# Patient Record
Sex: Female | Born: 1940
Health system: Southern US, Community
[De-identification: ages and names within clinical notes are randomized; demographics above are authoritative.]

## PROBLEM LIST (undated history)

## (undated) DIAGNOSIS — M1990 Unspecified osteoarthritis, unspecified site: Secondary | ICD-10-CM

## (undated) DIAGNOSIS — R011 Cardiac murmur, unspecified: Secondary | ICD-10-CM

## (undated) DIAGNOSIS — H269 Unspecified cataract: Secondary | ICD-10-CM

## (undated) DIAGNOSIS — K219 Gastro-esophageal reflux disease without esophagitis: Secondary | ICD-10-CM

## (undated) DIAGNOSIS — K56609 Unspecified intestinal obstruction, unspecified as to partial versus complete obstruction: Secondary | ICD-10-CM

## (undated) DIAGNOSIS — T7840XA Allergy, unspecified, initial encounter: Secondary | ICD-10-CM

## (undated) DIAGNOSIS — M810 Age-related osteoporosis without current pathological fracture: Secondary | ICD-10-CM

## (undated) DIAGNOSIS — I10 Essential (primary) hypertension: Secondary | ICD-10-CM

## (undated) HISTORY — DX: Gastro-esophageal reflux disease without esophagitis: K21.9

## (undated) HISTORY — DX: Essential (primary) hypertension: I10

## (undated) HISTORY — DX: Unspecified cataract: H26.9

## (undated) HISTORY — DX: Age-related osteoporosis without current pathological fracture: M81.0

## (undated) HISTORY — DX: Cardiac murmur, unspecified: R01.1

## (undated) HISTORY — PX: COLONOSCOPY: SHX174

## (undated) HISTORY — DX: Unspecified osteoarthritis, unspecified site: M19.90

## (undated) HISTORY — PX: UPPER GASTROINTESTINAL ENDOSCOPY: SHX188

## (undated) HISTORY — PX: CATARACT EXTRACTION: SUR2

## (undated) HISTORY — DX: Allergy, unspecified, initial encounter: T78.40XA

## (undated) HISTORY — PX: LAPAROSCOPIC HYSTERECTOMY: SHX1926

## (undated) HISTORY — DX: Unspecified intestinal obstruction, unspecified as to partial versus complete obstruction: K56.609

---

## 1975-10-09 HISTORY — PX: TUBAL LIGATION: SHX77

## 1988-10-08 HISTORY — PX: KNEE ARTHROSCOPY WITH EXCISION BAKER'S CYST: SHX5646

## 1988-10-08 HISTORY — PX: WISDOM TOOTH EXTRACTION: SHX21

## 1998-01-26 ENCOUNTER — Other Ambulatory Visit: Admission: RE | Admit: 1998-01-26 | Discharge: 1998-01-26 | Payer: Self-pay | Admitting: Gynecology

## 1998-08-03 ENCOUNTER — Other Ambulatory Visit: Admission: RE | Admit: 1998-08-03 | Discharge: 1998-08-03 | Payer: Self-pay | Admitting: Gynecology

## 1998-08-10 ENCOUNTER — Ambulatory Visit (HOSPITAL_COMMUNITY): Admission: RE | Admit: 1998-08-10 | Discharge: 1998-08-10 | Payer: Self-pay | Admitting: Obstetrics & Gynecology

## 1998-11-02 ENCOUNTER — Encounter: Payer: Self-pay | Admitting: Cardiology

## 1998-11-24 ENCOUNTER — Other Ambulatory Visit: Admission: RE | Admit: 1998-11-24 | Discharge: 1998-11-24 | Payer: Self-pay | Admitting: Internal Medicine

## 1998-11-24 ENCOUNTER — Encounter: Payer: Self-pay | Admitting: Internal Medicine

## 1999-01-03 ENCOUNTER — Other Ambulatory Visit: Admission: RE | Admit: 1999-01-03 | Discharge: 1999-01-03 | Payer: Self-pay | Admitting: Orthopedic Surgery

## 1999-01-17 ENCOUNTER — Other Ambulatory Visit: Admission: RE | Admit: 1999-01-17 | Discharge: 1999-01-17 | Payer: Self-pay | Admitting: Gynecology

## 1999-08-07 ENCOUNTER — Other Ambulatory Visit: Admission: RE | Admit: 1999-08-07 | Discharge: 1999-08-07 | Payer: Self-pay | Admitting: Gynecology

## 2000-01-23 ENCOUNTER — Other Ambulatory Visit: Admission: RE | Admit: 2000-01-23 | Discharge: 2000-01-23 | Payer: Self-pay | Admitting: Gynecology

## 2000-01-23 ENCOUNTER — Encounter (INDEPENDENT_AMBULATORY_CARE_PROVIDER_SITE_OTHER): Payer: Self-pay | Admitting: Specialist

## 2000-08-22 ENCOUNTER — Other Ambulatory Visit: Admission: RE | Admit: 2000-08-22 | Discharge: 2000-08-22 | Payer: Self-pay | Admitting: Gynecology

## 2001-08-25 ENCOUNTER — Other Ambulatory Visit: Admission: RE | Admit: 2001-08-25 | Discharge: 2001-08-25 | Payer: Self-pay | Admitting: Gynecology

## 2002-08-27 ENCOUNTER — Other Ambulatory Visit: Admission: RE | Admit: 2002-08-27 | Discharge: 2002-08-27 | Payer: Self-pay | Admitting: Gynecology

## 2003-08-24 ENCOUNTER — Other Ambulatory Visit: Admission: RE | Admit: 2003-08-24 | Discharge: 2003-08-24 | Payer: Self-pay | Admitting: Gynecology

## 2003-12-27 ENCOUNTER — Encounter: Payer: Self-pay | Admitting: Internal Medicine

## 2004-08-24 ENCOUNTER — Other Ambulatory Visit: Admission: RE | Admit: 2004-08-24 | Discharge: 2004-08-24 | Payer: Self-pay | Admitting: Gynecology

## 2004-10-08 HISTORY — PX: BREAST ENHANCEMENT SURGERY: SHX7

## 2005-08-27 ENCOUNTER — Other Ambulatory Visit: Admission: RE | Admit: 2005-08-27 | Discharge: 2005-08-27 | Payer: Self-pay | Admitting: Gynecology

## 2008-08-17 DIAGNOSIS — Z8719 Personal history of other diseases of the digestive system: Secondary | ICD-10-CM | POA: Insufficient documentation

## 2008-08-17 DIAGNOSIS — R197 Diarrhea, unspecified: Secondary | ICD-10-CM | POA: Insufficient documentation

## 2008-08-17 DIAGNOSIS — R141 Gas pain: Secondary | ICD-10-CM | POA: Insufficient documentation

## 2008-08-17 DIAGNOSIS — R143 Flatulence: Secondary | ICD-10-CM

## 2008-08-17 DIAGNOSIS — K2980 Duodenitis without bleeding: Secondary | ICD-10-CM | POA: Insufficient documentation

## 2008-08-17 DIAGNOSIS — R142 Eructation: Secondary | ICD-10-CM

## 2008-08-17 DIAGNOSIS — K5730 Diverticulosis of large intestine without perforation or abscess without bleeding: Secondary | ICD-10-CM | POA: Insufficient documentation

## 2008-08-20 ENCOUNTER — Ambulatory Visit: Payer: Self-pay | Admitting: Internal Medicine

## 2008-08-20 DIAGNOSIS — R109 Unspecified abdominal pain: Secondary | ICD-10-CM | POA: Insufficient documentation

## 2008-11-24 ENCOUNTER — Ambulatory Visit: Payer: Self-pay | Admitting: Internal Medicine

## 2008-12-14 ENCOUNTER — Ambulatory Visit: Payer: Self-pay | Admitting: Internal Medicine

## 2011-01-01 ENCOUNTER — Other Ambulatory Visit: Payer: Self-pay | Admitting: Gynecology

## 2013-09-21 ENCOUNTER — Ambulatory Visit: Payer: Self-pay | Admitting: Podiatry

## 2013-09-21 ENCOUNTER — Ambulatory Visit (INDEPENDENT_AMBULATORY_CARE_PROVIDER_SITE_OTHER): Payer: Medicare PPO | Admitting: Podiatry

## 2013-09-21 ENCOUNTER — Encounter: Payer: Self-pay | Admitting: Podiatry

## 2013-09-21 ENCOUNTER — Ambulatory Visit (INDEPENDENT_AMBULATORY_CARE_PROVIDER_SITE_OTHER): Payer: Medicare PPO

## 2013-09-21 VITALS — BP 139/80 | HR 62 | Resp 12 | Ht 65.0 in | Wt 135.0 lb

## 2013-09-21 DIAGNOSIS — M722 Plantar fascial fibromatosis: Secondary | ICD-10-CM

## 2013-09-21 DIAGNOSIS — R52 Pain, unspecified: Secondary | ICD-10-CM

## 2013-09-21 MED ORDER — TRIAMCINOLONE ACETONIDE 10 MG/ML IJ SUSP
10.0000 mg | Freq: Once | INTRAMUSCULAR | Status: AC
  Administered 2013-09-21: 10 mg

## 2013-09-21 NOTE — Progress Notes (Signed)
   Subjective:    Patient ID: Jillian [name], female    DOB: 15-May-1941, 72 y.o.   MRN: [id number] "I have pain in my left heel. It hurts on this bone."    HPI Comments: N  Sore, aches L  Bunion Left Painful D  10 yrs. O  Gradually  C  Gotten worse A  Shoe pressure, high heels T  Adjust the size shoe I wear   Foot Pain This is a new (Heel Pain Lt) problem. Episode onset: September/October. The problem occurs daily. The problem has been gradually worsening. The symptoms are aggravated by walking and standing (getting up after resting). She has tried NSAIDs (Naproxen, night splint, stretching exercises, OTC orthotics) for the symptoms. The treatment provided mild relief.   Patient's primary concern is left inferior heel pain today. She relates a history of a right inferior heel pain that resolved with conservative care over a two-year period.   Review of Systems  Musculoskeletal: Positive for gait problem.  All other systems reviewed and are negative.       Objective:   Physical Exam  Subjective: Orientated x26 72 year old white female  Vascular: The DP and and PT pulses are two over four bilaterally. Capillary fill is immediate bilaterally.  Neurological: Knee and ankle reflexes equal and reactive bilaterally  Dermatological: Texture and turgor within normal limits  Musculoskeletal: HAV deformities noted bilaterally. Left first MPJ is limited in dorsiflexion. Exquisite palpable tenderness medial plantar left heel which duplicates her area of discomfort. There are no palpable lesions.  X-ray examination see attached report demonstrates a well-organized inferior heel spur left and narrowing of the left first MPJ with some spur formation.       Assessment & Plan:   Assessment: Plantar fasciitis left Hallux limitus left  Plan: Skin is prepped with alcohol and Betadine and 10 mg of Kenalog mixed with 10 mg of plain Xylocaine and 2.5 mg of plain Marcaine are injected into the  inferior heel left for Kenalog injection #1. Shoeing and stretching discussed. Patient has existing night splint which she will continue to wear. I advised to return if the symptoms are not improving in the next 30 days.  In regards to the left hallux limitus rigidus, patient states she is having minimal discomfort at this time and we will defer on active treatment at this point, and focus our attention on the left plantar fasciitis.

## 2013-09-21 NOTE — Patient Instructions (Signed)
Plantar Fasciitis (Heel Spur Syndrome)  with Rehab  The plantar fascia is a fibrous, ligament-like, soft-tissue structure that spans the bottom of the foot. Plantar fasciitis is a condition that causes pain in the foot due to inflammation of the tissue.  SYMPTOMS   · Pain and tenderness on the underneath side of the foot.  · Pain that worsens with standing or walking.  CAUSES   Plantar fasciitis is caused by irritation and injury to the plantar fascia on the underneath side of the foot. Common mechanisms of injury include:  · Direct trauma to bottom of the foot.  · Damage to a small nerve that runs under the foot where the main fascia attaches to the heel bone.  · Stress placed on the plantar fascia due to bone spurs.  RISK INCREASES WITH:   · Activities that place stress on the plantar fascia (running, jumping, pivoting, or cutting).  · Poor strength and flexibility.  · Improperly fitted shoes.  · Tight calf muscles.  · Flat feet.  · Failure to warm-up properly before activity.  · Obesity.  PREVENTION  · Warm up and stretch properly before activity.  · Allow for adequate recovery between workouts.  · Maintain physical fitness:  · Strength, flexibility, and endurance.  · Cardiovascular fitness.  · Maintain a health body weight.  · Avoid stress on the plantar fascia.  · Wear properly fitted shoes, including arch supports for individuals who have flat feet.  PROGNOSIS   If treated properly, then the symptoms of plantar fasciitis usually resolve without surgery. However, occasionally surgery is necessary.  RELATED COMPLICATIONS   · Recurrent symptoms that may result in a chronic condition.  · Problems of the lower back that are caused by compensating for the injury, such as limping.  · Pain or weakness of the foot during push-off following surgery.  · Chronic inflammation, scarring, and partial or complete fascia tear, occurring more often from repeated injections.  TREATMENT   Treatment initially involves the use of  ice and medication to help reduce pain and inflammation. The use of strengthening and stretching exercises may help reduce pain with activity, especially stretches of the Achilles tendon. These exercises may be performed at home or with a therapist. Your caregiver may recommend that you use heel cups of arch supports to help reduce stress on the plantar fascia. Occasionally, corticosteroid injections are given to reduce inflammation. If symptoms persist for greater than 6 months despite non-surgical (conservative), then surgery may be recommended.   MEDICATION   · If pain medication is necessary, then nonsteroidal anti-inflammatory medications, such as aspirin and ibuprofen, or other minor pain relievers, such as acetaminophen, are often recommended.  · Do not take pain medication within 7 days before surgery.  · Prescription pain relievers may be given if deemed necessary by your caregiver. Use only as directed and only as much as you need.  · Corticosteroid injections may be given by your caregiver. These injections should be reserved for the most serious cases, because they may only be given a certain number of times.  HEAT AND COLD  · Cold treatment (icing) relieves pain and reduces inflammation. Cold treatment should be applied for 10 to 15 minutes every 2 to 3 hours for inflammation and pain and immediately after any activity that aggravates your symptoms. Use ice packs or massage the area with a piece of ice (ice massage).  · Heat treatment may be used prior to performing the stretching and strengthening activities prescribed   by your caregiver, physical therapist, or athletic trainer. Use a heat pack or soak the injury in warm water.  SEEK IMMEDIATE MEDICAL CARE IF:  · Treatment seems to offer no benefit, or the condition worsens.  · Any medications produce adverse side effects.  EXERCISES  RANGE OF MOTION (ROM) AND STRETCHING EXERCISES - Plantar Fasciitis (Heel Spur Syndrome)  These exercises may help you  when beginning to rehabilitate your injury. Your symptoms may resolve with or without further involvement from your physician, physical therapist or athletic trainer. While completing these exercises, remember:   · Restoring tissue flexibility helps normal motion to return to the joints. This allows healthier, less painful movement and activity.  · An effective stretch should be held for at least 30 seconds.  · A stretch should never be painful. You should only feel a gentle lengthening or release in the stretched tissue.  RANGE OF MOTION - Toe Extension, Flexion  · Sit with your right / left leg crossed over your opposite knee.  · Grasp your toes and gently pull them back toward the top of your foot. You should feel a stretch on the bottom of your toes and/or foot.  · Hold this stretch for __________ seconds.  · Now, gently pull your toes toward the bottom of your foot. You should feel a stretch on the top of your toes and or foot.  · Hold this stretch for __________ seconds.  Repeat __________ times. Complete this stretch __________ times per day.   RANGE OF MOTION - Ankle Dorsiflexion, Active Assisted  · Remove shoes and sit on a chair that is preferably not on a carpeted surface.  · Place right / left foot under knee. Extend your opposite leg for support.  · Keeping your heel down, slide your right / left foot back toward the chair until you feel a stretch at your ankle or calf. If you do not feel a stretch, slide your bottom forward to the edge of the chair, while still keeping your heel down.  · Hold this stretch for __________ seconds.  Repeat __________ times. Complete this stretch __________ times per day.   STRETCH  Gastroc, Standing  · Place hands on wall.  · Extend right / left leg, keeping the front knee somewhat bent.  · Slightly point your toes inward on your back foot.  · Keeping your right / left heel on the floor and your knee straight, shift your weight toward the wall, not allowing your back to  arch.  · You should feel a gentle stretch in the right / left calf. Hold this position for __________ seconds.  Repeat __________ times. Complete this stretch __________ times per day.  STRETCH  Soleus, Standing  · Place hands on wall.  · Extend right / left leg, keeping the other knee somewhat bent.  · Slightly point your toes inward on your back foot.  · Keep your right / left heel on the floor, bend your back knee, and slightly shift your weight over the back leg so that you feel a gentle stretch deep in your back calf.  · Hold this position for __________ seconds.  Repeat __________ times. Complete this stretch __________ times per day.  STRETCH  Gastrocsoleus, Standing   Note: This exercise can place a lot of stress on your foot and ankle. Please complete this exercise only if specifically instructed by your caregiver.   · Place the ball of your right / left foot on a step, keeping   your other foot firmly on the same step.  · Hold on to the wall or a rail for balance.  · Slowly lift your other foot, allowing your body weight to press your heel down over the edge of the step.  · You should feel a stretch in your right / left calf.  · Hold this position for __________ seconds.  · Repeat this exercise with a slight bend in your right / left knee.  Repeat __________ times. Complete this stretch __________ times per day.   STRENGTHENING EXERCISES - Plantar Fasciitis (Heel Spur Syndrome)   These exercises may help you when beginning to rehabilitate your injury. They may resolve your symptoms with or without further involvement from your physician, physical therapist or athletic trainer. While completing these exercises, remember:   · Muscles can gain both the endurance and the strength needed for everyday activities through controlled exercises.  · Complete these exercises as instructed by your physician, physical therapist or athletic trainer. Progress the resistance and repetitions only as guided.  STRENGTH - Towel  Curls  · Sit in a chair positioned on a non-carpeted surface.  · Place your foot on a towel, keeping your heel on the floor.  · Pull the towel toward your heel by only curling your toes. Keep your heel on the floor.  · If instructed by your physician, physical therapist or athletic trainer, add ____________________ at the end of the towel.  Repeat __________ times. Complete this exercise __________ times per day.  STRENGTH - Ankle Inversion  · Secure one end of a rubber exercise band/tubing to a fixed object (table, pole). Loop the other end around your foot just before your toes.  · Place your fists between your knees. This will focus your strengthening at your ankle.  · Slowly, pull your big toe up and in, making sure the band/tubing is positioned to resist the entire motion.  · Hold this position for __________ seconds.  · Have your muscles resist the band/tubing as it slowly pulls your foot back to the starting position.  Repeat __________ times. Complete this exercises __________ times per day.   Document Released: 09/24/2005 Document Revised: 12/17/2011 Document Reviewed: 01/06/2009  [employer]® Patient Information ©2014 [employer].

## 2013-11-20 ENCOUNTER — Encounter: Payer: Self-pay | Admitting: Internal Medicine

## 2014-01-15 ENCOUNTER — Ambulatory Visit (AMBULATORY_SURGERY_CENTER): Payer: Self-pay | Admitting: *Deleted

## 2014-01-15 VITALS — Ht 65.0 in | Wt 137.0 lb

## 2014-01-15 DIAGNOSIS — Z86010 Personal history of colonic polyps: Secondary | ICD-10-CM

## 2014-01-15 MED ORDER — MOVIPREP 100 G PO SOLR: ORAL | Status: DC

## 2014-01-15 NOTE — Progress Notes (Signed)
No allergies to eggs or soy. No problems with anesthesia.  Pt given Emmi instructions for colonoscopy  

## 2014-01-25 ENCOUNTER — Other Ambulatory Visit: Payer: Self-pay | Admitting: Gynecology

## 2014-01-28 ENCOUNTER — Encounter: Payer: Self-pay | Admitting: Internal Medicine

## 2014-01-29 ENCOUNTER — Encounter: Payer: Self-pay | Admitting: Internal Medicine

## 2014-02-10 ENCOUNTER — Encounter: Payer: Self-pay | Admitting: Internal Medicine

## 2014-02-10 ENCOUNTER — Ambulatory Visit (AMBULATORY_SURGERY_CENTER): Payer: Medicare PPO | Admitting: Internal Medicine

## 2014-02-10 VITALS — BP 128/77 | HR 45 | Temp 97.6°F | Resp 16 | Ht 65.0 in | Wt 137.0 lb

## 2014-02-10 DIAGNOSIS — D129 Benign neoplasm of anus and anal canal: Secondary | ICD-10-CM

## 2014-02-10 DIAGNOSIS — Z86010 Personal history of colonic polyps: Secondary | ICD-10-CM

## 2014-02-10 DIAGNOSIS — D128 Benign neoplasm of rectum: Secondary | ICD-10-CM

## 2014-02-10 DIAGNOSIS — D126 Benign neoplasm of colon, unspecified: Secondary | ICD-10-CM

## 2014-02-10 DIAGNOSIS — Z800 Family history of malignant neoplasm of digestive organs: Secondary | ICD-10-CM

## 2014-02-10 DIAGNOSIS — Z1211 Encounter for screening for malignant neoplasm of colon: Secondary | ICD-10-CM

## 2014-02-10 MED ORDER — SODIUM CHLORIDE 0.9 % IV SOLN: 500.0000 mL | INTRAVENOUS | Status: DC

## 2014-02-10 NOTE — Progress Notes (Signed)
A/ox3 pleased with MAC, report to April RN 

## 2014-02-10 NOTE — Progress Notes (Signed)
Called to room to assist during endoscopic procedure.  Patient ID and intended procedure confirmed with present staff. Received instructions for my participation in the procedure from the performing physician.  

## 2014-02-10 NOTE — Patient Instructions (Signed)

## 2014-02-10 NOTE — Op Note (Signed)
[facility]  [address] [place], [id number]   COLONOSCOPY PROCEDURE REPORT  PATIENT: Jillian [name], Jillian [name]  MR#: [id number] BIRTHDATE: 1941-06-19 , 73  yrs. old GENDER: Female ENDOSCOPIST: Lafayette Dragon, MD REFERRED DI:YMEBRA Delight Hoh, M.D. PROCEDURE DATE:  02/10/2014 PROCEDURE:   Colonoscopy with cold biopsy polypectomy First Screening Colonoscopy - Avg.  risk and is 50 yrs.  old or older - No.  Prior Negative Screening - Now for repeat screening. Above average risk  History of Adenoma - Now for follow-up colonoscopy & has been > or = to 3 yrs.  N/A  Polyps Removed Today? Yes. ASA CLASS:   Class II INDICATIONS:mother with colon cancer. Previous colonoscopies in 1994, 2000, 2005 and 2000 and MEDICATIONS: MAC sedation, administered by CRNA and propofol (Diprivan) 200mg  IV  DESCRIPTION OF PROCEDURE:   After the risks benefits and alternatives of the procedure were thoroughly explained, informed consent was obtained.  A digital rectal exam revealed no abnormalities of the rectum.   The LB [id number]  endoscope was introduced through the anus and advanced to the cecum, which was identified by both the appendix and ileocecal valve. No adverse events experienced.   The quality of the prep was good, using MoviPrep  The instrument was then slowly withdrawn as the colon was fully examined.      COLON FINDINGS: A sessile polyp ranging between 5-75mm in size was found in the rectum.  A polypectomy was performed with cold forceps.  The resection was complete and the polyp tissue was completely retrieved.   Mild diverticulosis was noted in the sigmoid colon.  Retroflexed views revealed no abnormalities. The time to cecum=6 minutes 05 seconds.  Withdrawal time=8 minutes 03 seconds.  The scope was withdrawn and the procedure completed. COMPLICATIONS: There were no complications.  ENDOSCOPIC IMPRESSION: 1.   Sessile polyp ranging between 5-50mm in size was found in  the rectum; polypectomy was performed with cold forceps 2.   Mild diverticulosis was noted in the sigmoid colon  RECOMMENDATIONS: 1.  Await pathology results 2.  high fiber diet Recall colonoscopy in 5 years pending path report   eSigned:  Lafayette Dragon, MD 02/10/2014 9:57 AM   cc:   PATIENT NAME:  Jillian [name], Jillian [name] MR#: [id number]

## 2014-02-11 ENCOUNTER — Telehealth: Payer: Self-pay

## 2014-02-11 NOTE — Telephone Encounter (Signed)
  Follow up Call-  Call back number 02/10/2014  Post procedure Call Back phone  # [phone]  Permission to leave phone message Yes     Patient questions:  Do you have a fever, pain , or abdominal swelling? no Pain Score  0 *  Have you tolerated food without any problems? yes  Have you been able to return to your normal activities? yes  Do you have any questions about your discharge instructions: Diet   no Medications  no Follow up visit  no  Do you have questions or concerns about your Care? no  Actions: * If pain score is 4 or above: No action needed, pain <4.  Per the pt "everything is fine". maw

## 2014-02-15 ENCOUNTER — Encounter: Payer: Self-pay | Admitting: Internal Medicine

## 2015-01-14 ENCOUNTER — Telehealth: Payer: Self-pay | Admitting: Internal Medicine

## 2015-01-14 NOTE — Telephone Encounter (Signed)
Patient reports symptoms for 3 weeks approximately. She has spells of diarrhea with urgency. It may occurs after eating or it may awaken her from sleep. It is unpredictable and intermittent. She states there is no pain, no blood and no nausea. She has eliminated foods from her diet but it does not affect the diarrhea spells. No recent travel. She has city water. She states she does not feel sick. Please advise.

## 2015-03-16 DIAGNOSIS — Z0000 Encounter for general adult medical examination without abnormal findings: Secondary | ICD-10-CM | POA: Diagnosis not present

## 2015-03-16 DIAGNOSIS — K219 Gastro-esophageal reflux disease without esophagitis: Secondary | ICD-10-CM | POA: Diagnosis not present

## 2015-03-16 DIAGNOSIS — M8580 Other specified disorders of bone density and structure, unspecified site: Secondary | ICD-10-CM | POA: Diagnosis not present

## 2015-03-16 DIAGNOSIS — J309 Allergic rhinitis, unspecified: Secondary | ICD-10-CM | POA: Diagnosis not present

## 2015-03-16 DIAGNOSIS — Z1212 Encounter for screening for malignant neoplasm of rectum: Secondary | ICD-10-CM | POA: Diagnosis not present

## 2015-06-17 DIAGNOSIS — J029 Acute pharyngitis, unspecified: Secondary | ICD-10-CM | POA: Diagnosis not present

## 2015-07-05 ENCOUNTER — Telehealth: Payer: Self-pay | Admitting: Internal Medicine

## 2015-07-05 NOTE — Telephone Encounter (Signed)
Patient calling due to change in bowels. States she is having random episodes of urgent diarrhea. States she was shopping a couple weeks ago and had urgent urge for BM that was lots of diarrhea. She also woke up during the night yesterday and had urgent diarrhea. She states she had some pencil like stools a couple of times also. She is going out of the country in [place] and would like to be seen to evaluate this prior to her tip.Former [employer] pt. Scheduled with Dr. Havery Moros on 07/12/15 at 2:45 PM.

## 2015-07-12 ENCOUNTER — Encounter: Payer: Self-pay | Admitting: Gastroenterology

## 2015-07-12 ENCOUNTER — Other Ambulatory Visit (INDEPENDENT_AMBULATORY_CARE_PROVIDER_SITE_OTHER): Payer: Medicare PPO

## 2015-07-12 ENCOUNTER — Ambulatory Visit (INDEPENDENT_AMBULATORY_CARE_PROVIDER_SITE_OTHER): Payer: Medicare PPO | Admitting: Gastroenterology

## 2015-07-12 VITALS — BP 122/72 | HR 60 | Ht 65.0 in | Wt 133.4 lb

## 2015-07-12 DIAGNOSIS — R197 Diarrhea, unspecified: Secondary | ICD-10-CM

## 2015-07-12 LAB — COMPREHENSIVE METABOLIC PANEL
ALT: 13 U/L (ref 0–35)
AST: 15 U/L (ref 0–37)
Albumin: 4.2 g/dL (ref 3.5–5.2)
Alkaline Phosphatase: 50 U/L (ref 39–117)
BILIRUBIN TOTAL: 0.6 mg/dL (ref 0.2–1.2)
BUN: 23 mg/dL (ref 6–23)
CO2: 29 meq/L (ref 19–32)
CREATININE: 0.93 mg/dL (ref 0.40–1.20)
Calcium: 9.3 mg/dL (ref 8.4–10.5)
Chloride: 106 mEq/L (ref 96–112)
GFR: 62.39 mL/min (ref >60.00)
GLUCOSE: 90 mg/dL (ref 70–99)
Potassium: 4.4 mEq/L (ref 3.5–5.1)
SODIUM: 142 meq/L (ref 135–145)
Total Protein: 6.8 g/dL (ref 6.0–8.3)

## 2015-07-12 LAB — CBC WITH DIFFERENTIAL/PLATELET
BASOS ABS: 0 10*3/uL (ref 0.0–0.1)
Basophils Relative: 0.3 % (ref 0.0–3.0)
Eosinophils Absolute: 0.1 10*3/uL (ref 0.0–0.7)
Eosinophils Relative: 1.8 % (ref 0.0–5.0)
HEMATOCRIT: 40.9 % (ref 36.0–46.0)
Hemoglobin: 13.9 g/dL (ref 12.0–15.0)
LYMPHS ABS: 1.6 10*3/uL (ref 0.7–4.0)
LYMPHS PCT: 29.6 % (ref 12.0–46.0)
MCHC: 34 g/dL (ref 30.0–36.0)
MCV: 93.8 fl (ref 78.0–100.0)
MONO ABS: 0.4 10*3/uL (ref 0.1–1.0)
Monocytes Relative: 7.8 % (ref 3.0–12.0)
NEUTROS ABS: 3.2 10*3/uL (ref 1.4–7.7)
NEUTROS PCT: 60.5 % (ref 43.0–77.0)
PLATELETS: 225 10*3/uL (ref 150.0–400.0)
RBC: 4.36 Mil/uL (ref 3.87–5.11)
RDW: 12.8 % (ref 11.5–15.5)
WBC: 5.3 10*3/uL (ref 4.0–10.5)

## 2015-07-12 LAB — IGA: IgA: 248 mg/dL (ref 68–378)

## 2015-07-12 LAB — C-REACTIVE PROTEIN: CRP: <0.1 mg/dL — ABNORMAL LOW (ref 0.5–20.0)

## 2015-07-12 LAB — TSH: TSH: 1.6 u[IU]/mL (ref 0.35–4.50)

## 2015-07-12 NOTE — Patient Instructions (Signed)
Your physician has requested that you go to the basement for  lab work before leaving today.   

## 2015-07-12 NOTE — Progress Notes (Signed)
HPI :  74 y/o female former patient of Dr. Olevia Perches here for follow up. She has been followed mostly for screening colonoscopies as she has a FH of colon cancer. She for the most part has denied problems with her bowels over the years but she reports several months ago she has developed intermittent acute onset urgency of diarrhea with urgency. She has had this during the night, waking her from sleep at times. She report she has had bouts of urgent diarrhea, she thinks one day per month or twice per month over this time. In between episodes she has not had any symptoms and without diarrhea. However she reports her stool form has been variable, anywhere from normal form and frequency, other times she has soft stools, or pencil thin stools, or loose stools. She has tried dietary changes which have not provided much benefit. She has not tried probiotics. She states stool frequency can range from 1-3 times per day. She also has had some borborygmi after she eats, with increased gas and bloating. No abdominal pains however. No weight loss. No fevers. She takes protonix once daily for GERD, which was changed in June, previously on Dexilant, but symptoms started prior to this switch. She reports protonix works well to control her heartburn and this is not bothering her at present time. She reports she is enrolled in an Alzheimer's study at [facility] - she receiving an infusion one per month, she is not sure if she is receiving the drug or placebo. She has been enrolled since September 2015. She thinks she has been having symptoms at least for 6 months or so in regards to her bowels, she is not sure if it is related to the study drug. No FH of celiac or Crohns disease.  Patient reports her mother had colon cancer, she was diagnosed age 52-80s.    Colonoscopy May 2015 - sigmoid diverticulosis, rectal hyperplastic polyps, no inflammatory changes    Past Medical History  Diagnosis Date  . GERD (gastroesophageal  reflux disease)   . Cataract      Past Surgical History  Procedure Laterality Date  . Tubal ligation  1977  . Knee arthroscopy with excision baker's cyst Left 1990  . Wisdom tooth extraction  1990  . Cataract extraction Bilateral 10/2012, 01/2013  . Breast enhancement surgery Bilateral 2006    and face lift   Family History  Problem Relation Age of Onset  . Alzheimer's disease Mother   . Colon cancer Mother 68  . Alzheimer's disease Father    Social History  Substance Use Topics  . Smoking status: Former Smoker    Quit date: 10/08/1990  . Smokeless tobacco: Never Used  . Alcohol Use: 4.2 oz/week    7 Glasses of wine per week     Comment: glass of wine at night    Current Outpatient Prescriptions  Medication Sig Dispense Refill  . pantoprazole (PROTONIX) 40 MG tablet Take 40 mg by mouth daily.    Marland Kitchen ipratropium (ATROVENT) 0.03 % nasal spray      No current facility-administered medications for this visit.   No Known Allergies   Review of Systems: All systems reviewed and negative except where noted in HPI.   No recent labs noted in [place].   Physical Exam: BP 122/72 mmHg  Pulse 60  Ht 5\' 5"  (1.651 m)  Wt 133 lb 6 oz (60.499 kg)  BMI 22.19 kg/m2 Constitutional: Pleasant,well-developed, female in no acute distress. HEENT: Normocephalic and atraumatic.  Conjunctivae are normal. No scleral icterus. Neck supple.  Cardiovascular: Normal rate, regular rhythm.  Pulmonary/chest: Effort normal and breath sounds normal. No wheezing, rales or rhonchi. Abdominal: Soft, nondistended, nontender. Bowel sounds active throughout. There are no masses palpable. No hepatomegaly. Extremities: no edema Lymphadenopathy: No cervical adenopathy noted. Neurological: Alert and oriented to person place and time. Skin: Skin is warm and dry. No rashes noted. Psychiatric: Normal mood and affect. Behavior is normal.   ASSESSMENT AND PLAN: 74 y/o female with regular bowel habits historically  presenting with several months worth of intermittent severe diarrhea a which occurs a few days per month, with otherwise variable bowel habits in between episodes. Colonoscopy in May 2015 was normal without obvious inflammatory changes however she had no symptoms at the time of this exam. She reports being enrolled in a blinded study for Alzheimer's research at [facility] since September, which does correlate to the time her symptoms may have started, unclear if she is receiving placebo or research drug. It is possible she is having a side effect from this medication causing these symptoms and I asked her to touch base with the study administrators to notify them about this. Otherwise, will send basic labs to include CBC, CMP, CRP, TSH, and will screen for celiac given her strong gas/bloating component. I'll also check fecal lactoferrin to ensure normal. In the interim recommend she take daily Citrucel to help regularize her bowel habits. If symptoms persist, we can consider a colonoscopy to rule out microscopic colitis, however given the timeline of symptoms do suspect this could be a medication reaction from her study and would address this with her study administrators first. GERD is otherwise well controlled on protonix and think this is less likely to have caused her bowel habit changes since they began prior to this switch. She agreed. I will let her know of lab results when they have all been completed.    Manderson Cellar, MD [facility]

## 2015-07-13 ENCOUNTER — Other Ambulatory Visit: Payer: Medicare PPO

## 2015-07-13 DIAGNOSIS — R197 Diarrhea, unspecified: Secondary | ICD-10-CM

## 2015-07-13 LAB — TISSUE TRANSGLUTAMINASE, IGA: Tissue Transglutaminase Ab, IgA: 1 U/mL (ref <4)

## 2015-07-14 ENCOUNTER — Other Ambulatory Visit: Payer: Medicare PPO

## 2015-07-14 ENCOUNTER — Other Ambulatory Visit: Payer: Self-pay | Admitting: *Deleted

## 2015-07-14 DIAGNOSIS — R197 Diarrhea, unspecified: Secondary | ICD-10-CM

## 2015-07-14 LAB — FECAL LACTOFERRIN, QUANT: LACTOFERRIN: POSITIVE

## 2015-07-15 ENCOUNTER — Other Ambulatory Visit: Payer: Medicare PPO

## 2015-07-15 DIAGNOSIS — R197 Diarrhea, unspecified: Secondary | ICD-10-CM | POA: Diagnosis not present

## 2015-07-19 LAB — OVA AND PARASITE EXAMINATION: OP: NONE SEEN

## 2016-02-28 ENCOUNTER — Encounter: Payer: Self-pay | Admitting: Internal Medicine

## 2017-01-08 ENCOUNTER — Ambulatory Visit (INDEPENDENT_AMBULATORY_CARE_PROVIDER_SITE_OTHER): Payer: Medicare Other | Admitting: Podiatry

## 2017-01-08 ENCOUNTER — Telehealth: Payer: Self-pay | Admitting: *Deleted

## 2017-01-08 ENCOUNTER — Encounter: Payer: Self-pay | Admitting: Podiatry

## 2017-01-08 ENCOUNTER — Ambulatory Visit (HOSPITAL_BASED_OUTPATIENT_CLINIC_OR_DEPARTMENT_OTHER)
Admission: RE | Admit: 2017-01-08 | Discharge: 2017-01-08 | Disposition: A | Discharge status: Home / Self Care | Payer: Medicare Other | Source: Ambulatory Visit | Attending: Podiatry | Admitting: Podiatry

## 2017-01-08 VITALS — BP 123/81 | HR 66 | Resp 18

## 2017-01-08 DIAGNOSIS — M7742 Metatarsalgia, left foot: Secondary | ICD-10-CM | POA: Diagnosis not present

## 2017-01-08 DIAGNOSIS — M189 Osteoarthritis of first carpometacarpal joint, unspecified: Secondary | ICD-10-CM | POA: Insufficient documentation

## 2017-01-08 DIAGNOSIS — M779 Enthesopathy, unspecified: Secondary | ICD-10-CM

## 2017-01-08 DIAGNOSIS — R52 Pain, unspecified: Secondary | ICD-10-CM | POA: Diagnosis present

## 2017-01-08 MED ORDER — MELOXICAM 15 MG PO TABS: 15.0000 mg | ORAL_TABLET | Freq: Every day | ORAL | 0 refills | Status: DC

## 2017-01-08 NOTE — Progress Notes (Signed)
   Subjective:    Patient ID: Jillian [name], female    DOB: June 05, 1941, 76 y.o.   MRN: [id number]  HPI  76 year old female presents the office if concerns of left foot pain which then ongoing for about 1 month and was worsening over the weekend after doing a lot of standing. She didn't wear a metatarsal support which is been helping. She has no pain when she wears tennis shoes with her orthotics however when she is not wearing this is when she starts to get pain. She denies any numbness or tingling to the toes. She denies any change or increase in activity denies any recent injury or trauma. Senna with her treatment other than taking naproxen last night which did help. No other complaints at this time   Review of Systems  All other systems reviewed and are negative.      Objective:   Physical Exam General: AAO x3, NAD  Dermatological: Skin is warm, dry and supple bilateral. Nails x 10 are well manicured; remaining integument appears unremarkable at this time. There are no open sores, no preulcerative lesions, no rash or signs of infection present.  Vascular: Dorsalis Pedis artery and Posterior Tibial artery pedal pulses are 2/4 bilateral with immedate capillary fill time.  There is no pain with calf compression, swelling, warmth, erythema.   Neruologic: Grossly intact via light touch bilateral. Vibratory intact via tuning fork bilateral. Protective threshold with Semmes Wienstein monofilament intact to all pedal sites bilateral  Musculoskeletal: there is mild edema to the left forefoot. There is no tenderness the dorsal aspect of the metatarsals. There is tenderness mostly along submetatarsal 2 and 3 with majority tenderness along the interspace of the second interspace and the left foot plantarly. No palpable neuromas identified. There is no erythema or increase in warmth. There is no pain vibratory sensation.Muscular strength 5/5 in all groups tested bilateral.  Gait: Unassisted,  Nonantalgic.     Assessment & Plan:  76 year old female left foot metatarsalgia, capsulitis -Treatment options discussed including all alternatives, risks, and complications -Etiology of symptoms were discussed -X-rays were obtained and reviewed with the patient.  Unable to appreciate any evidence of acute fracture.  -discusses steroid injection to the left foot and she wishes to proceed. Under sterile conditions a mixture of Kenalog and local anesthetic was infiltrated into the second interspace without complications. Post injection care was discussed. -Prescribed mobic. Discussed side effects of the medication and directed to stop if any are to occur and call the office.  -Metatarsal pads -RTC in 3 weeks or sooner if needed. If no improvement will re x-ray to rule out stress fracture but as this has been ongoing for about 1 month at this time I do not believe this is likely.   Celesta Gentile, DPM

## 2017-01-08 NOTE — Telephone Encounter (Signed)
Pt states mobic is not at the pharmacy. I informed pt the rx had been called to pharmacy.

## 2017-03-16 ENCOUNTER — Encounter (HOSPITAL_COMMUNITY): Payer: Self-pay | Admitting: Family Medicine

## 2017-03-16 ENCOUNTER — Ambulatory Visit (HOSPITAL_COMMUNITY)
Admission: EM | Admit: 2017-03-16 | Discharge: 2017-03-16 | Disposition: A | Discharge status: Home / Self Care | Payer: Medicare Other | Attending: Internal Medicine | Admitting: Internal Medicine

## 2017-03-16 DIAGNOSIS — J209 Acute bronchitis, unspecified: Secondary | ICD-10-CM | POA: Diagnosis not present

## 2017-03-16 MED ORDER — HYDROCOD POLST-CPM POLST ER 10-8 MG/5ML PO SUER: 5.0000 mL | Freq: Two times a day (BID) | ORAL | 0 refills | Status: AC | PRN

## 2017-03-16 NOTE — ED Triage Notes (Signed)
Pt here for 2 days of cough and congestion.

## 2017-03-16 NOTE — Discharge Instructions (Signed)
You have acute bronchitis today. Please take the cough medicine as prescribed. You may also use over-the-counter Delsym or Robitussin. Please follow-up with the primary care doctor for no improvement.

## 2017-03-16 NOTE — ED Provider Notes (Signed)
CSN: [id number]     Arrival date & time 03/16/17  1205 History   First MD Initiated Contact with Patient 03/16/17 1256     Chief Complaint  Patient presents with  . Cough  . Nasal Congestion   (Consider location/radiation/quality/duration/timing/severity/associated sxs/prior Treatment) Patient is a 76 y.o. Female, fairly healthy, is here for dry cough and congestion x 2 days. She just came back recently from a trip to [place] on 03/10/17. She reports tickle in her throat and +PND. She otherwise denies wheezing, SOB, CP, chest tightness, Abd pain, N/V/D or fever. She have tried OTC mucinex with no relief.       Past Medical History:  Diagnosis Date  . Cataract   . GERD (gastroesophageal reflux disease)    Past Surgical History:  Procedure Laterality Date  . BREAST ENHANCEMENT SURGERY Bilateral 2006   and face lift  . CATARACT EXTRACTION Bilateral 10/2012, 01/2013  . KNEE ARTHROSCOPY WITH EXCISION BAKER'S CYST Left 1990  . TUBAL LIGATION  1977  . WISDOM TOOTH EXTRACTION  1990   Family History  Problem Relation Age of Onset  . Alzheimer's disease Mother   . Colon cancer Mother 59  . Alzheimer's disease Father    Social History  Substance Use Topics  . Smoking status: Former Smoker    Quit date: 10/08/1990  . Smokeless tobacco: Never Used  . Alcohol use 4.2 oz/week    7 Glasses of wine per week     Comment: glass of wine at night    OB History    No data available     Review of Systems  Constitutional: Negative for chills, fatigue and fever.  HENT: Positive for congestion and postnasal drip. Negative for ear pain, rhinorrhea, sinus pain, sinus pressure, sneezing and sore throat.   Respiratory: Positive for cough. Negative for shortness of breath and wheezing.   Cardiovascular: Negative for chest pain and palpitations.  Gastrointestinal: Negative for abdominal pain and diarrhea.  Neurological: Negative for dizziness and headaches.    Allergies  Patient has no known  allergies.  Home Medications   Prior to Admission medications   Medication Sig Start Date End Date Taking? Authorizing Provider  ipratropium (ATROVENT) 0.03 % nasal spray  02/02/14   [provider]  loratadine (CLARITIN) 10 MG tablet Take 10 mg by mouth daily.    [provider]  meloxicam (MOBIC) 15 MG tablet Take 1 tablet (15 mg total) by mouth daily. 01/08/17   Trula Slade, DPM  Multiple Vitamin (MULTIVITAMIN) tablet Take 1 tablet by mouth daily. Vit D3, Biotin, Citucel    [provider]  pantoprazole (PROTONIX) 40 MG tablet Take 40 mg by mouth daily.    [provider]   Meds Ordered and Administered this Visit  Medications - No data to display  BP (!) 153/82   Pulse 72   Temp 98.6 F (37 C)   Resp 18   SpO2 97%  No data found.   Physical Exam  Constitutional: She is oriented to person, place, and time. She appears well-developed and well-nourished. No distress.  HENT:  Head: Normocephalic and atraumatic.  Right Ear: External ear normal.  Left Ear: External ear normal.  Nose: Nose normal.  Mouth/Throat: Oropharynx is clear and moist. No oropharyngeal exudate.  TM pearly gray with no erythema.   Eyes: Conjunctivae are normal. Pupils are equal, round, and reactive to light. Right eye exhibits no discharge. Left eye exhibits no discharge.  Neck: Normal range of motion.  Neck supple.  Cardiovascular: Normal rate, regular rhythm and normal heart sounds.   No murmur heard. Pulmonary/Chest: Breath sounds normal. No respiratory distress. She has no wheezes.  Abdominal: Soft. Bowel sounds are normal. There is no tenderness.  Lymphadenopathy:    She has no cervical adenopathy.  Neurological: She is alert and oriented to person, place, and time.  Skin: Skin is warm and dry. No rash noted. She is not diaphoretic.  Nursing note and vitals reviewed.   Urgent Care Course     Procedures (including critical care time)  Labs Review Labs  Reviewed - No data to display  Imaging Review No results found.  MDM   1. Acute bronchitis, unspecified organism    Clinical presentation is most consistent with acute bronchitis. Patient educated on the diagnosis. Informed to rest and hydration. Informed that antibiotic is not indicated at the moment. Prescription Tussionex given for cough. May also use OTC delsym or Robitussin if not able to afford prescribed cough syrup.     Barry Dienes, NP 03/16/17 1309

## 2018-02-04 ENCOUNTER — Ambulatory Visit: Payer: Medicare Other | Admitting: Physician Assistant

## 2018-02-04 ENCOUNTER — Encounter: Payer: Self-pay | Admitting: Physician Assistant

## 2018-02-04 VITALS — BP 102/68 | HR 72 | Ht 64.0 in | Wt 133.4 lb

## 2018-02-04 DIAGNOSIS — R197 Diarrhea, unspecified: Secondary | ICD-10-CM | POA: Diagnosis not present

## 2018-02-04 DIAGNOSIS — Z800 Family history of malignant neoplasm of digestive organs: Secondary | ICD-10-CM

## 2018-02-04 NOTE — Patient Instructions (Signed)
If you are age 78 or older, your body mass index should be between 23-30. Your Body mass index is 22.89 kg/m. If this is out of the aforementioned range listed, please consider follow up with your Primary Care Provider.  Restart Protonix 40 mg every 3-4 days.  Stay off artificial sweetners. Give yourself trial of restarting dairy.  You are due for a follow up colonoscopy 02/2019.   Call us back if you would want to try Zantac and if you decide you want a colonoscopy this year with Dr. Havery Moros.

## 2018-02-04 NOTE — Progress Notes (Signed)
Subjective:    Patient ID: Jillian [name], female    DOB: 1941-03-30, 77 y.o.   MRN: [id number]  HPI Jillian [name] is a pleasant 77 year old white female, established with Dr. Havery Moros who comes in today with complaints of diarrhea. Patient is generally in good health, has history of GERD, gastritis, and diverticulosis. She does have family history of colon cancer in her mother diagnosed at age 69. Patient had colonoscopy done in May 2015 per Dr. Delfin Edis with finding of mild sigmoid diverticulosis, she had one 5 to 9 mm polyp removed which was hyperplastic. When patient was seen here in October 2016 by Dr. Havery Moros she had had complaints of several month history of intermittent diarrhea.  She was placed on Citrucel.  Based on labs were done including TSH and celiac markers all of which were negative. She says that diarrhea seemed to resolve and she had not had much problem with it until the past few months.  She is having frequent episodes of diarrhea and on those days will have 5-6 bowel movements per day.  She does have days without a bowel movement in days with normal bowel movements.  She also tends to have significant urgency at times but has not had any incontinence.  She has no complaints of abdominal pain or discomfort, no cramping no nausea.  Appetite is been fine, weight has been stable. She has not been placed on any new medications, she continues to take Citrucel at bedtime.  She has been in a study over the past 3 years for prevention of Alzheimer's.  She receives an infusion once a month but is not aware whether she is on placebo or receiving drug at this point.  She is recently signed up to continue the study for another 18 months. When she started having problems with recurrent diarrhea she decided to go off of lactose and also went off of all artificial sweeteners.  She had been drinking diet sodas etc. regularly.  She also stopped taking her Protonix which she had been on once every 4  days and switch to Dexilant every 4 days which she had several tablets left of from an old prescription.  At this point symptoms have improved again she has not had any episodes of diarrhea over the past week. She is asking for advice how to proceed from here.   Review of Systems Pertinent positive and negative review of systems were noted in the above HPI section.  All other review of systems was otherwise negative.  Outpatient Encounter Medications as of 02/04/2018  Medication Sig  . Dexlansoprazole 30 MG capsule Take 30 mg by mouth daily.  Marland Kitchen ipratropium (ATROVENT) 0.03 % nasal spray   . loratadine (CLARITIN) 10 MG tablet Take 10 mg by mouth daily as needed.   . Methylcellulose, Laxative, (CITRUCEL PO) Take by mouth at bedtime.  . pantoprazole (PROTONIX) 40 MG tablet Take 40 mg by mouth daily.  . [DISCONTINUED] meloxicam (MOBIC) 15 MG tablet Take 1 tablet (15 mg total) by mouth daily.  . [DISCONTINUED] Multiple Vitamin (MULTIVITAMIN) tablet Take 1 tablet by mouth daily. Vit D3, Biotin, Citucel   No facility-administered encounter medications on file as of 02/04/2018.    No Known Allergies Patient Active Problem List   Diagnosis Date Noted  . ABDOMINAL PAIN, UNSPECIFIED SITE 08/20/2008  . DUODENITIS 08/17/2008  . DIVERTICULOSIS, COLON 08/17/2008  . ABDOMINAL BLOATING 08/17/2008  . DIARRHEA 08/17/2008  . GASTRITIS, HX OF 08/17/2008   Social History  Socioeconomic History  . Marital status: Married    Spouse name: Not on file  . Number of children: Not on file  . Years of education: Not on file  . Highest education level: Not on file  Occupational History  . Occupation: Retired  Scientific laboratory technician  . Financial resource strain: Not on file  . Food insecurity:    Worry: Not on file    Inability: Not on file  . Transportation needs:    Medical: Not on file    Non-medical: Not on file  Tobacco Use  . Smoking status: Former Smoker    Last attempt to quit: 10/08/1990    Years since  quitting: 27.3  . Smokeless tobacco: Never Used  Substance and Sexual Activity  . Alcohol use: Yes    Alcohol/week: 4.2 oz    Types: 7 Glasses of wine per week    Comment: glass of wine at night   . Drug use: No  . Sexual activity: Not on file  Lifestyle  . Physical activity:    Days per week: Not on file    Minutes per session: Not on file  . Stress: Not on file  Relationships  . Social connections:    Talks on phone: Not on file    Gets together: Not on file    Attends religious service: Not on file    Active member of club or organization: Not on file    Attends meetings of clubs or organizations: Not on file    Relationship status: Not on file  . Intimate partner violence:    Fear of current or ex partner: Not on file    Emotionally abused: Not on file    Physically abused: Not on file    Forced sexual activity: Not on file  Other Topics Concern  . Not on file  Social History Narrative  . Not on file    Ms. Wisdom's family history includes Alzheimer's disease in her father and mother; Colon cancer (age of onset: 51) in her mother.      Objective:    Vitals:   02/04/18 1015  BP: 102/68  Pulse: 72    Physical Exam; well-developed elderly white female in no acute distress, appears younger than her stated age.  Blood pressure 102/68, pulse 72, height 5 foot 4, weight 133, BMI 22.8.  HEENT; nontraumatic normocephalic EOMI PERRLA sclera anicteric, Oropharynx benign, neck supple.  Cardiovascular; regular rate and rhythm with S1-S2 no murmur rub or gallop, Pulmonary; clear bilaterally, Abdomen ;soft, nontender nondistended bowel sounds are active there is no palpable mass or hepatosplenomegaly.  Rectal ;exam not done, Extremities; no clubbing cyanosis or edema skin warm and dry, Neuro; patient alert and oriented x4, nonfocal.  Psych ;mood and affect appropriate       Assessment & Plan:   #1 77 year old white female with recurrence of intermittent episodes of diarrhea  which have been increasing in frequency over the past few months associated with urgency but no abdominal pain cramping or incontinence. Patient had presented with similar symptoms about 2-1/2 years ago.  Initial work-up was negative, symptoms resolved and she continued on Citrucel.  She is currently improved on a lactose-free diet and with avoidance of artificial sweeteners suggesting IBS type symptoms and/or dietary intolerances. He also continues on in a study for prevention of Alzheimer's which she is been in over the past 3 years and does not know whether she is receiving study drug or placebo.  I suspect this does  not have much to do with her symptoms as she has been on the same regimen over the past 3 years  #2+ family history of colon cancer-patient's mother age 49.  Patient up-to-date with colonoscopy would be due for interval surveillance in May 2020 #3 diverticulosis #4 mild GERD-symptoms controlled with q. 3 to 4-day dosing of PPI  Plan; Advised patient to stay off of artificial sweeteners .  She will give herself a trial of resumption of lactose and monitor for recurrence of symptoms.  If she has recurrence of symptoms then she should generally avoid lactose and/or use Lactaid tablets on a as needed basis which we discussed today She will continue Citrucel at bedtime .  It is possible that Protonix could cause diarrhea.  She will also go back to Protonix every 3 to 4 days and monitor for recurrence of symptoms.  I discussed switching to ranitidine 150 mg daily or every other day.  She will consider this, but wants to see how she does when she resumes Protonix first. We also discussed colonoscopy.  I offered to schedule her for colonoscopy now with Dr. Havery Moros.  She wants to see how she does over the next month or so and when she does the above  adjustments ,if she continues to have diarrhea she will call back to be scheduled for colonoscopy.   Rhema Boyett Genia Harold PA-C 02/04/2018   Cc:  Deland Pretty, MD

## 2018-02-04 NOTE — Progress Notes (Signed)
Agree with assessment and plan as outlined.  

## 2018-02-10 ENCOUNTER — Telehealth: Payer: Self-pay | Admitting: *Deleted

## 2018-02-10 NOTE — Telephone Encounter (Signed)
Routed office note from [facility] , dated 02/04/2018 to Deland Pretty MD.

## 2019-02-06 DIAGNOSIS — C541 Malignant neoplasm of endometrium: Secondary | ICD-10-CM

## 2019-02-06 HISTORY — PX: BIOPSY ENDOMETRIAL: PRO11

## 2019-02-06 HISTORY — DX: Malignant neoplasm of endometrium: C54.1

## 2019-03-02 ENCOUNTER — Emergency Department (HOSPITAL_COMMUNITY)
Admission: EM | Admit: 2019-03-02 | Discharge: 2019-03-03 | Disposition: A | Discharge status: Home / Self Care | Payer: Medicare Other | Attending: Emergency Medicine | Admitting: Emergency Medicine

## 2019-03-02 ENCOUNTER — Encounter (HOSPITAL_COMMUNITY): Payer: Self-pay | Admitting: Emergency Medicine

## 2019-03-02 ENCOUNTER — Other Ambulatory Visit: Payer: Self-pay

## 2019-03-02 DIAGNOSIS — Z87891 Personal history of nicotine dependence: Secondary | ICD-10-CM | POA: Insufficient documentation

## 2019-03-02 DIAGNOSIS — Z79899 Other long term (current) drug therapy: Secondary | ICD-10-CM | POA: Insufficient documentation

## 2019-03-02 DIAGNOSIS — N939 Abnormal uterine and vaginal bleeding, unspecified: Secondary | ICD-10-CM | POA: Insufficient documentation

## 2019-03-02 DIAGNOSIS — N898 Other specified noninflammatory disorders of vagina: Secondary | ICD-10-CM | POA: Diagnosis present

## 2019-03-02 LAB — COMPREHENSIVE METABOLIC PANEL
ALT: 16 U/L (ref 0–44)
AST: 20 U/L (ref 15–41)
Albumin: 4.2 g/dL (ref 3.5–5.0)
Alkaline Phosphatase: 53 U/L (ref 38–126)
Anion gap: 12 (ref 5–15)
BUN: 20 mg/dL (ref 8–23)
CO2: 22 mmol/L (ref 22–32)
Calcium: 9.3 mg/dL (ref 8.9–10.3)
Chloride: 105 mmol/L (ref 98–111)
Creatinine, Ser: 1.09 mg/dL — ABNORMAL HIGH (ref 0.44–1.00)
GFR calc Af Amer: 56 mL/min — ABNORMAL LOW (ref >60)
GFR calc non Af Amer: 48 mL/min — ABNORMAL LOW (ref >60)
Glucose, Bld: 127 mg/dL — ABNORMAL HIGH (ref 70–99)
Potassium: 3.8 mmol/L (ref 3.5–5.1)
Sodium: 139 mmol/L (ref 135–145)
Total Bilirubin: 0.4 mg/dL (ref 0.3–1.2)
Total Protein: 6.8 g/dL (ref 6.5–8.1)

## 2019-03-02 LAB — CBC
HCT: 41.2 % (ref 36.0–46.0)
Hemoglobin: 13.8 g/dL (ref 12.0–15.0)
MCH: 31.4 pg (ref 26.0–34.0)
MCHC: 33.5 g/dL (ref 30.0–36.0)
MCV: 93.8 fL (ref 80.0–100.0)
Platelets: 222 10*3/uL (ref 150–400)
RBC: 4.39 MIL/uL (ref 3.87–5.11)
RDW: 12.2 % (ref 11.5–15.5)
WBC: 5.8 10*3/uL (ref 4.0–10.5)
nRBC: 0 % (ref 0.0–0.2)

## 2019-03-02 NOTE — ED Provider Notes (Signed)
[facility] EMERGENCY DEPARTMENT Provider Note   CSN: [id number] Arrival date & time: 03/02/19  2206    History   Chief Complaint Chief Complaint  Patient presents with   Vaginal Bleeding    HPI Jillian [name] is a 78 y.o. female.     Home with sudden onset vaginal bleeding tonight while lying in bed.  States she felt a large gush of fluid and noticed there is brown discharge that she thought was bloody.  She has had several towels worth of bleeding since.  She denies any pain currently but has had crampy lower abdominal pain for several weeks.  She does not have this evaluated.  No nausea, vomiting or fever.  She does not take any blood thinners.  Denies any dizziness or lightheadedness.  She still has a uterus and denies any history of gynecological issues.  She has been in menopause for many years.  She states she has not had issues like this in the past.  She denies any chest pain or shortness of breath.  She is confident this is vaginal not rectal bleeding.  The history is provided by the patient.  Vaginal Bleeding  Associated symptoms: abdominal pain   Associated symptoms: no back pain, no dizziness, no dysuria, no fever and no nausea    She states she has not had Past Medical History:  Diagnosis Date   Cataract    GERD (gastroesophageal reflux disease)     Patient Active Problem List   Diagnosis Date Noted   ABDOMINAL PAIN, UNSPECIFIED SITE 08/20/2008   DUODENITIS 08/17/2008   DIVERTICULOSIS, COLON 08/17/2008   ABDOMINAL BLOATING 08/17/2008   DIARRHEA 08/17/2008   GASTRITIS, HX OF 08/17/2008    Past Surgical History:  Procedure Laterality Date   BREAST ENHANCEMENT SURGERY Bilateral 2006   and face lift   CATARACT EXTRACTION Bilateral 10/2012, 01/2013   KNEE ARTHROSCOPY WITH EXCISION BAKER'S CYST Left 1990   TUBAL LIGATION  1977   WISDOM TOOTH EXTRACTION  1990     OB History   No obstetric history on file.      Home  Medications    Prior to Admission medications   Medication Sig Start Date End Date Taking? Authorizing Provider  Dexlansoprazole 30 MG capsule Take 30 mg by mouth daily.    [provider]  ipratropium (ATROVENT) 0.03 % nasal spray  02/02/14   [provider]  loratadine (CLARITIN) 10 MG tablet Take 10 mg by mouth daily as needed.     [provider]  Methylcellulose, Laxative, (CITRUCEL PO) Take by mouth at bedtime.    [provider]  pantoprazole (PROTONIX) 40 MG tablet Take 40 mg by mouth daily.    [provider]    Family History Family History  Problem Relation Age of Onset   Alzheimer's disease Mother    Colon cancer Mother 66   Alzheimer's disease Father     Social History Social History   Tobacco Use   Smoking status: Former Smoker    Last attempt to quit: 10/08/1990    Years since quitting: 28.4   Smokeless tobacco: Never Used  Substance Use Topics   Alcohol use: Yes    Alcohol/week: 7.0 standard drinks    Types: 7 Glasses of wine per week    Comment: glass of wine at night    Drug use: No     Allergies   Patient has no known allergies.   Review of Systems Review of Systems  Constitutional: Negative for activity change, appetite change and fever.  HENT: Negative for congestion.   Eyes: Negative for visual disturbance.  Respiratory: Negative for cough, chest tightness and shortness of breath.   Cardiovascular: Negative for chest pain and leg swelling.  Gastrointestinal: Positive for abdominal pain. Negative for nausea and vomiting.  Genitourinary: Positive for vaginal bleeding. Negative for dysuria and genital sores.  Musculoskeletal: Negative for arthralgias, back pain and myalgias.  Neurological: Negative for dizziness, weakness and headaches.   all other systems are negative except as noted in the HPI and PMH.     Physical Exam Updated Vital Signs BP (!) 171/88 (BP Location: Left Arm)    Pulse (!)  102    Temp 98 F (36.7 C) (Oral)    Resp 20    Ht 5\' 4"  (1.626 m)    Wt 60.8 kg    SpO2 100%    BMI 23.00 kg/m   Physical Exam Vitals signs and nursing note reviewed.  Constitutional:      General: She is not in acute distress.    Appearance: She is well-developed.  HENT:     Head: Normocephalic and atraumatic.     Mouth/Throat:     Pharynx: No oropharyngeal exudate.  Eyes:     Conjunctiva/sclera: Conjunctivae normal.     Pupils: Pupils are equal, round, and reactive to light.  Neck:     Musculoskeletal: Normal range of motion and neck supple.     Comments: No meningismus. Cardiovascular:     Rate and Rhythm: Normal rate and regular rhythm.     Heart sounds: Normal heart sounds. No murmur.  Pulmonary:     Effort: Pulmonary effort is normal. No respiratory distress.     Breath sounds: Normal breath sounds.  Abdominal:     Palpations: Abdomen is soft.     Tenderness: There is abdominal tenderness. There is no guarding or rebound.     Comments: Mild lower abdominal tenderness, no guarding or rebound  Genitourinary:    Comments: Chaperone present. Dark brown discharge from vagina. No palpable masses on digital exam.  Hemoccult brown without gross blood. Musculoskeletal: Normal range of motion.        General: No tenderness.  Skin:    General: Skin is warm.     Capillary Refill: Capillary refill takes less than 2 seconds.  Neurological:     General: No focal deficit present.     Mental Status: She is alert and oriented to person, place, and time. Mental status is at baseline.     Cranial Nerves: No cranial nerve deficit.     Motor: No abnormal muscle tone.     Coordination: Coordination normal.     Comments: No ataxia on finger to nose bilaterally. No pronator drift. 5/5 strength throughout. CN 2-12 intact.Equal grip strength. Sensation intact.   Psychiatric:        Behavior: Behavior normal.      ED Treatments / Results  Labs (all labs ordered are listed, but only  abnormal results are displayed) Labs Reviewed  COMPREHENSIVE METABOLIC PANEL - Abnormal; Notable for the following components:      Result Value   Glucose, Bld 127 (*)    Creatinine, Ser 1.09 (*)    GFR calc non Af Amer 48 (*)    GFR calc Af Amer 56 (*)    All other components within normal limits  HCG, QUANTITATIVE, PREGNANCY - Abnormal; Notable for the following components:   hCG, Beta Chain, Quant,  S 5 (*)    All other components within normal limits  URINALYSIS, ROUTINE W REFLEX MICROSCOPIC - Abnormal; Notable for the following components:   Color, Urine STRAW (*)    Hgb urine dipstick LARGE (*)    Leukocytes,Ua SMALL (*)    All other components within normal limits  POC OCCULT BLOOD, ED - Abnormal; Notable for the following components:   Fecal Occult Bld POSITIVE (*)    All other components within normal limits  URINE CULTURE  CBC  PROTIME-INR  POC OCCULT BLOOD, ED  TYPE AND SCREEN    EKG None  Radiology Ct Abdomen Pelvis W Contrast  Result Date: 03/03/2019 CLINICAL DATA:  Vaginal bleeding EXAM: CT ABDOMEN AND PELVIS WITH CONTRAST TECHNIQUE: Multidetector CT imaging of the abdomen and pelvis was performed using the standard protocol following bolus administration of intravenous contrast. CONTRAST:  167mL OMNIPAQUE IOHEXOL 300 MG/ML  SOLN COMPARISON:  None. FINDINGS: Lower chest: There is a 5 mm pulmonary nodule in the right lower lobe (axial series 3, image 1). The heart size is unremarkable. Hepatobiliary: No focal liver abnormality is seen. No gallstones, gallbladder wall thickening, or biliary dilatation. Pancreas: Unremarkable. No pancreatic ductal dilatation or surrounding inflammatory changes. Spleen: Normal in size without focal abnormality. Adrenals/Urinary Tract: Adrenal glands are unremarkable. Kidneys are normal, without renal calculi, focal lesion, or hydronephrosis. Bladder is unremarkable. Stomach/Bowel: There is scattered colonic diverticula without CT evidence of  diverticulitis. There is no evidence of a small-bowel obstruction. The appendix is not reliably identified, however there are no significant inflammatory changes in the right lower quadrant. The stomach is unremarkable. Vascular/Lymphatic: Aortic atherosclerosis. No enlarged abdominal or pelvic lymph nodes. Reproductive: The endometrial canal is diffusely dilated measuring approximately 4.2 cm. There is fluid in the lower uterine segment. Multiple hypoattenuating nodules are noted in the myometrium measuring up to 2 cm. There is no ovarian mass. Other: No abdominal wall hernia or abnormality. No abdominopelvic ascites. Musculoskeletal: No acute or significant osseous findings. IMPRESSION: 1. Fluid-filled and dilated endometrial canal. There are multiple small hypoattenuating nodules in the myometrium. Findings are highly suspicious for underlying malignancy in a postmenopausal patient. Gynecologic follow-up is recommended. There are no pathologically enlarged lymph nodes identified on today's exam. 2. Small 5 mm pulmonary nodule in the right lower lobe. Follow-up is recommended. Non-contrast chest CT can be considered in 12 months if patient is high-risk. This recommendation follows the consensus statement: Guidelines for Management of Incidental Pulmonary Nodules Detected on CT Images: From the Fleischner Society 2017; Radiology 2017; 284:228-243. Electronically Signed   By: Constance Holster M.D.   On: 03/03/2019 00:20    Procedures Procedures (including critical care time)  Medications Ordered in ED Medications - No data to display   Initial Impression / Assessment and Plan / ED Course  I have reviewed the triage vital signs and the nursing notes.  Pertinent labs & imaging results that were available during my care of the patient were reviewed by me and considered in my medical decision making (see chart for details).       Vaginal bleeding.  Stable vital signs.  Abdomen soft without peritoneal  signs.  No blood thinner use.  Hemoglobin is 13.8.  Blood pressure and heart rate are stable.  Orthostatics are negative.  Patient does not take any blood thinners.  Concern for GYN carcinoma until proven otherwise.  CT scan shows dilated endometrium and uterus with suspicion for underlying malignancy.  Shows a pulmonary nodule which needs follow-up.  This was discussed with Dr. Harolyn Rutherford gynecology who will arrange for endometrial biopsy in the next 2 days.  Patient to receive phone call.  Recommended to start Megace 40 mg twice daily.  Patient understands this is highly concerning for something cancerous.  Results discussed with patient.  She remains hemodynamically stable.  She understands she needs close follow-up for a endometrial biopsy in 2 days. She is comfortable going home. She is given the gynecology phone number to call if she is not called first.  She understands to return to the ED with worsening bleeding, dizziness, lightheadedness, chest pain, shortness of breath or other concerns.  BP (!) 146/80    Pulse 67    Temp 98 F (36.7 C) (Oral)    Resp 18    Ht 5\' 4"  (1.626 m)    Wt 60.8 kg    SpO2 100%    BMI 23.00 kg/m    Final Clinical Impressions(s) / ED Diagnoses   Final diagnoses:  Abnormal uterine bleeding    ED Discharge Orders    None       Ezequiel Essex, MD 03/03/19 [phone]

## 2019-03-02 NOTE — ED Triage Notes (Signed)
Pt was in bed asleep and woke up to a gush of brown/red vaginal bleeding. Pt states she has had intermittent lower abdominal pains but none today. Denies fever, cough, SOB. Pt tearful and anxious

## 2019-03-02 NOTE — ED Triage Notes (Signed)
Large amount of bleeding on pad in wheelchair

## 2019-03-03 ENCOUNTER — Telehealth: Payer: Self-pay | Admitting: Student

## 2019-03-03 ENCOUNTER — Emergency Department (HOSPITAL_COMMUNITY): Payer: Medicare Other

## 2019-03-03 LAB — URINALYSIS, ROUTINE W REFLEX MICROSCOPIC
Bacteria, UA: NONE SEEN
Bilirubin Urine: NEGATIVE
Glucose, UA: NEGATIVE mg/dL
Ketones, ur: NEGATIVE mg/dL
Nitrite: NEGATIVE
Protein, ur: NEGATIVE mg/dL
Specific Gravity, Urine: 1.008 (ref 1.005–1.030)
pH: 7 (ref 5.0–8.0)

## 2019-03-03 LAB — URINE CULTURE: Culture: NO GROWTH

## 2019-03-03 LAB — PROTIME-INR
INR: 1 (ref 0.8–1.2)
Prothrombin Time: 13 seconds (ref 11.4–15.2)

## 2019-03-03 LAB — POC OCCULT BLOOD, ED: Fecal Occult Bld: POSITIVE — AB

## 2019-03-03 LAB — TYPE AND SCREEN
ABO/RH(D): A NEG
Antibody Screen: POSITIVE
PT AG Type: NEGATIVE

## 2019-03-03 LAB — HCG, QUANTITATIVE, PREGNANCY: hCG, Beta Chain, Quant, S: 5 m[IU]/mL — ABNORMAL HIGH (ref <5)

## 2019-03-03 MED ORDER — IOHEXOL 300 MG/ML  SOLN
100.0000 mL | Freq: Once | INTRAMUSCULAR | Status: AC | PRN
  Administered 2019-03-03: 100 mL via INTRAVENOUS

## 2019-03-03 MED ORDER — MEGESTROL ACETATE 40 MG PO TABS
40.0000 mg | ORAL_TABLET | Freq: Once | ORAL | Status: AC
  Administered 2019-03-03: 40 mg via ORAL
  Filled 2019-03-03 (×2): qty 1

## 2019-03-03 MED ORDER — MEGESTROL ACETATE 40 MG PO TABS: 40.0000 mg | ORAL_TABLET | Freq: Two times a day (BID) | ORAL | 0 refills | Status: DC

## 2019-03-03 NOTE — Telephone Encounter (Signed)
The patient called in stating she was told to schedule an appointment. An in-basket message was sent on 5/25.   *Seen in ED on 03/03/19 for vaginal bleeding, endometrial mass on CT scan. Schedule appointment in 2-3 days- per the message.  Scheduled the patient with Dr. Rip Harbour tomorrow as Dr. Harolyn Rutherford is not available.

## 2019-03-03 NOTE — Discharge Instructions (Signed)
As we discussed, your vaginal bleeding is likely something cancerous until proven otherwise.  You will receive a call in the next 2 days to have a biopsy in the office.  You should return to the ED if your bleeding becomes worse, you have worsening pain, dizziness, lightheadedness, any other concerns.

## 2019-03-04 ENCOUNTER — Other Ambulatory Visit (HOSPITAL_COMMUNITY)
Admission: RE | Admit: 2019-03-04 | Discharge: 2019-03-04 | Disposition: A | Discharge status: Home / Self Care | Payer: Medicare Other | Source: Ambulatory Visit | Attending: Obstetrics and Gynecology | Admitting: Obstetrics and Gynecology

## 2019-03-04 ENCOUNTER — Other Ambulatory Visit: Payer: Self-pay

## 2019-03-04 ENCOUNTER — Encounter: Payer: Self-pay | Admitting: Obstetrics and Gynecology

## 2019-03-04 ENCOUNTER — Ambulatory Visit: Payer: Medicare Other | Admitting: Obstetrics and Gynecology

## 2019-03-04 DIAGNOSIS — N950 Postmenopausal bleeding: Secondary | ICD-10-CM | POA: Insufficient documentation

## 2019-03-04 DIAGNOSIS — C541 Malignant neoplasm of endometrium: Secondary | ICD-10-CM | POA: Diagnosis not present

## 2019-03-04 NOTE — Patient Instructions (Signed)

## 2019-03-04 NOTE — Progress Notes (Signed)
Jillian [name] experienced PMB this past Monday. Seen in ER Endometrium on CT scan was abnormal Started on Megace. Has been able to start d/t insurance reasons. Working with pharmacy to correct. Pt informed very important to take Bleeding is now just a little heavier than spotting. Changes pad 3-4 times a day Not sexual active TSVD x 2 SAB x 1 Last pap 2014 normal per pt H/O HTN controlled with medication  Pt reports h/o EMBX in the past. Does not remember reasoning but results were negative   ENDOMETRIAL BIOPSY     The indications for endometrial biopsy were reviewed.   Risks of the biopsy including cramping, bleeding, infection, uterine perforation, inadequate specimen and need for additional procedures  were discussed. The patient states she understands and agrees to undergo procedure today. Consent was signed. Time out was performed.  During the pelvic exam, the cervix was prepped with Betadine. A single-toothed tenaculum was placed on the anterior lip of the cervix to stabilize it. The 3 mm pipelle was introduced into the endometrial cavity without difficulty to a depth of 7cm, and a moderate amount of tissue was obtained and sent to pathology. The instruments were removed from the patient's vagina. Minimal bleeding from the cervix was noted. The patient tolerated the procedure well. Routine post-procedure instructions were given to the patient.    A/P PMB S/P EMBX F/U and Tx as per Bx results. Pt to contact pharmacy about Megace Rx

## 2019-03-05 ENCOUNTER — Telehealth (INDEPENDENT_AMBULATORY_CARE_PROVIDER_SITE_OTHER): Payer: Medicare Other | Admitting: *Deleted

## 2019-03-05 DIAGNOSIS — N950 Postmenopausal bleeding: Secondary | ICD-10-CM

## 2019-03-05 MED ORDER — MEGESTROL ACETATE 40 MG PO TABS: 40.0000 mg | ORAL_TABLET | Freq: Two times a day (BID) | ORAL | 2 refills | Status: DC

## 2019-03-05 NOTE — Telephone Encounter (Signed)
Pt called stating that she talked with her pharmacy regarding her megace prescription and she states she was told that it requires prior authorization and they have been unable to contact the ER regarding that prior authorization as that is where the was prescribed.  Spoke with Dr. Rip Harbour who gave order to send the prescription.    Called pt to inform her that the prescription was called in.  Pt verbalized understanding.

## 2019-03-09 ENCOUNTER — Telehealth: Payer: Self-pay | Admitting: Obstetrics and Gynecology

## 2019-03-09 NOTE — Telephone Encounter (Signed)
Spoke to Jillian [name] regarding the results of her [facility] results. Pt would like to gather information possible referral to [facility] or [facility] for further evaluation and treatment. She will continue with Megace for now and contact our office with her choice and information.

## 2019-03-11 ENCOUNTER — Other Ambulatory Visit: Payer: Self-pay | Admitting: *Deleted

## 2019-03-11 DIAGNOSIS — N950 Postmenopausal bleeding: Secondary | ICD-10-CM

## 2019-03-12 ENCOUNTER — Telehealth: Payer: Self-pay | Admitting: Obstetrics and Gynecology

## 2019-03-12 ENCOUNTER — Encounter: Payer: Self-pay | Admitting: Obstetrics and Gynecology

## 2019-03-12 ENCOUNTER — Telehealth: Payer: Self-pay | Admitting: Obstetrics & Gynecology

## 2019-03-12 ENCOUNTER — Telehealth: Payer: Self-pay | Admitting: Gastroenterology

## 2019-03-12 NOTE — Progress Notes (Unsigned)
Patient ID: Jillian [name], female   DOB: May 13, 1941, 78 y.o.   MRN: [id number]   [place] [phone]) and spoke with Edwena Blow. She informed me that once the records are received and reviewed they will be reaching out to the patient to have her scheduled. Her records were faxed on 03/12/2019. Patient was made aware of this and she verbalized understanding.

## 2019-03-12 NOTE — Telephone Encounter (Signed)
The patient stated she is pleased with the service she received. No longer would like a call from the office manager.

## 2019-03-12 NOTE — Telephone Encounter (Signed)
The patient visited our office today to obtain her medical records. She also stated she would like to speak with the office manager in regards to a prescription being filled and referral being sent. Informed the patient of the request being submitted yesterday. Also informed the patient it may take up to 2-3 business days before the referral is completed. Issued the patient her records today. Also talked with a nurse about the authorization for the pharmacy. Informed the patient the authorization was placed yesterday at 4pm. Also informed of the referral being sent. TT called to make the appointment however she was informed the patient's case is being reviewed and they will call her with appointment information in the next week.

## 2019-03-12 NOTE — Telephone Encounter (Signed)
Pt would like to know whether to keep her appt with Dr. Havery Moros.  Pt is scheduled for appt with [facility].  Please advise.

## 2019-03-12 NOTE — Telephone Encounter (Signed)
Jillian [name] called to say she did not receive her surgical pathology report. I spoke with [name], and she resent it to make sure the report was in her records. Patient asked me to send a copy to her provider Dr. Deland Pretty.

## 2019-03-13 NOTE — Telephone Encounter (Signed)
Called patient back and let her know Dr. Havery Moros would like her to keep the telephone appt. withhim on 03/17/19. Patient good with that

## 2019-03-13 NOTE — Telephone Encounter (Signed)
Patient called and wanted to let us know [facility] confirmed they got her referral from patient's GYN. That they would review her records and then call her with an appt. In light of this, she wants to know if Dr. Havery [name] still wants her to keep her telephone visit on 03/17/19 ? Please advise

## 2019-03-13 NOTE — Telephone Encounter (Signed)
I believe she is going to see me to discuss possible colonoscopy, not sure what her other referral is for, but I would like to see her on 6/9 unless she feels otherwise. Thanks

## 2019-03-16 ENCOUNTER — Telehealth: Payer: Self-pay | Admitting: Obstetrics and Gynecology

## 2019-03-16 NOTE — Progress Notes (Signed)
Prescreened pt for 6-9 appt

## 2019-03-16 NOTE — Telephone Encounter (Signed)
Patient had called to see what happen to her refill request. After speaking to clinical staff, I was told they would call and see what happened. I called Jillian [name] back to give her this information. She was grateful for the call back.

## 2019-03-17 ENCOUNTER — Other Ambulatory Visit: Payer: Self-pay

## 2019-03-17 ENCOUNTER — Ambulatory Visit (INDEPENDENT_AMBULATORY_CARE_PROVIDER_SITE_OTHER): Payer: Medicare Other | Admitting: Gastroenterology

## 2019-03-17 ENCOUNTER — Encounter: Payer: Self-pay | Admitting: *Deleted

## 2019-03-17 ENCOUNTER — Encounter: Payer: Self-pay | Admitting: Gastroenterology

## 2019-03-17 VITALS — Ht 64.0 in | Wt 134.0 lb

## 2019-03-17 DIAGNOSIS — R198 Other specified symptoms and signs involving the digestive system and abdomen: Secondary | ICD-10-CM

## 2019-03-17 DIAGNOSIS — C55 Malignant neoplasm of uterus, part unspecified: Secondary | ICD-10-CM

## 2019-03-17 DIAGNOSIS — Z800 Family history of malignant neoplasm of digestive organs: Secondary | ICD-10-CM

## 2019-03-17 NOTE — Progress Notes (Signed)
THIS ENCOUNTER IS A VIRTUAL VISIT DUE TO COVID-19 - PATIENT WAS NOT SEEN IN THE OFFICE. PATIENT HAS CONSENTED TO VIRTUAL VISIT / TELEMEDICINE VISIT. PATIENT ENCOUNTER WAS VIA PHONE ONLY   Location of patient: home Location of provider: office Persons participating: myself, patient  HPI :  78 y/o female here for reassessment. She has chronic loose stools, mother with colon cancer.  Recently diagnosed with endometrial cancer this past week or so unfortunately. She had been having some postmenopausal bleeding, that occurred in her sleep. She was then referred to GYN and had an endometrial biopsy. She is in the process to be seen at the [facility] cancer center for management of this to discuss management. She initially was seen for this in the ED, they tested her stool for OB in the setting of vaginal bleeding and it was positive. She has no anemia.   She has some variable loose stools at times which has been longstanding. She has tried to cut back on dairy and cut out artifical sweeteners, stool frequency can vary. No blood in the stools. She remotely had a negative celiac workup in 2016 and stool studies. Symptoms of her bowels are mild and not too bothersome. She has borborygmi. No abdominal pains that are bothersome to her. She has some pelvic pains occasionally. She was taking citrucel previously with some variable success. She has not been using any immodium.  Patient reports her mother had colon cancer, she was diagnosed age 19-80s. She also had endometrial cancer at age 36. Her grandmother had ovarian cancer. Aunt had breast cancer   Prior screening exams: Colonoscopy 02/2014 - sigmoid diverticulosis, rectal hyperplastic polyps, no inflammatory changes Colonoscopy 12/2008 - no polyps, diverticulosis Colonoscopy 12/2003 - normal, diverticulosis   Past Medical History:  Diagnosis Date  . Cataract   . Endometrial cancer ([place]) 02/2019  . GERD (gastroesophageal reflux disease)   .  Hypertension      Past Surgical History:  Procedure Laterality Date  . BIOPSY ENDOMETRIAL  02/2019   endometrium carcinoma  . BREAST ENHANCEMENT SURGERY Bilateral 2006   and face lift  . CATARACT EXTRACTION Bilateral 10/2012, 01/2013  . KNEE ARTHROSCOPY WITH EXCISION BAKER'S CYST Left 1990  . TUBAL LIGATION  1977  . WISDOM TOOTH EXTRACTION  1990   Family History  Problem Relation Age of Onset  . Alzheimer's disease Mother   . Colon cancer Mother 68  . Endometrial cancer Mother   . Heart disease Mother   . Alzheimer's disease Father   . Ovarian cancer Maternal Grandmother   . Breast cancer Maternal Grandmother   . Esophageal cancer Neg Hx   . Stomach cancer Neg Hx    Social History   Tobacco Use  . Smoking status: Former Smoker    Last attempt to quit: 10/08/1990    Years since quitting: 28.4  . Smokeless tobacco: Never Used  Substance Use Topics  . Alcohol use: Yes    Alcohol/week: 7.0 standard drinks    Types: 7 Glasses of wine per week    Comment: glass of wine at night   . Drug use: No   Current Outpatient Medications  Medication Sig Dispense Refill  . ipratropium (ATROVENT) 0.03 % nasal spray     . loratadine (CLARITIN) 10 MG tablet Take 10 mg by mouth daily as needed.     . megestrol (MEGACE) 40 MG tablet Take 1 tablet (40 mg total) by mouth 2 (two) times daily. 60 tablet 2  . olmesartan (  BENICAR) 40 MG tablet Take 40 mg by mouth daily.    . pantoprazole (PROTONIX) 40 MG tablet Take 40 mg by mouth See admin instructions. Every 4 days     No current facility-administered medications for this visit.    No Known Allergies   Review of Systems: All systems reviewed and negative except where noted in HPI.    Ct Abdomen Pelvis W Contrast  Result Date: 03/03/2019 CLINICAL DATA:  Vaginal bleeding EXAM: CT ABDOMEN AND PELVIS WITH CONTRAST TECHNIQUE: Multidetector CT imaging of the abdomen and pelvis was performed using the standard protocol following bolus  administration of intravenous contrast. CONTRAST:  158mL OMNIPAQUE IOHEXOL 300 MG/ML  SOLN COMPARISON:  None. FINDINGS: Lower chest: There is a 5 mm pulmonary nodule in the right lower lobe (axial series 3, image 1). The heart size is unremarkable. Hepatobiliary: No focal liver abnormality is seen. No gallstones, gallbladder wall thickening, or biliary dilatation. Pancreas: Unremarkable. No pancreatic ductal dilatation or surrounding inflammatory changes. Spleen: Normal in size without focal abnormality. Adrenals/Urinary Tract: Adrenal glands are unremarkable. Kidneys are normal, without renal calculi, focal lesion, or hydronephrosis. Bladder is unremarkable. Stomach/Bowel: There is scattered colonic diverticula without CT evidence of diverticulitis. There is no evidence of a small-bowel obstruction. The appendix is not reliably identified, however there are no significant inflammatory changes in the right lower quadrant. The stomach is unremarkable. Vascular/Lymphatic: Aortic atherosclerosis. No enlarged abdominal or pelvic lymph nodes. Reproductive: The endometrial canal is diffusely dilated measuring approximately 4.2 cm. There is fluid in the lower uterine segment. Multiple hypoattenuating nodules are noted in the myometrium measuring up to 2 cm. There is no ovarian mass. Other: No abdominal wall hernia or abnormality. No abdominopelvic ascites. Musculoskeletal: No acute or significant osseous findings. IMPRESSION: 1. Fluid-filled and dilated endometrial canal. There are multiple small hypoattenuating nodules in the myometrium. Findings are highly suspicious for underlying malignancy in a postmenopausal patient. Gynecologic follow-up is recommended. There are no pathologically enlarged lymph nodes identified on today's exam. 2. Small 5 mm pulmonary nodule in the right lower lobe. Follow-up is recommended. Non-contrast chest CT can be considered in 12 months if patient is high-risk. This recommendation follows  the consensus statement: Guidelines for Management of Incidental Pulmonary Nodules Detected on CT Images: From the Fleischner Society 2017; Radiology 2017; 284:228-243. Electronically Signed   By: Constance Holster M.D.   On: 03/03/2019 00:20    Physical Exam: Ht 5\' 4"  (1.626 m) Comment: pt provided over the phone  Wt 134 lb (60.8 kg) Comment: pt provided over the phone  BMI 23.00 kg/m  NA   ASSESSMENT AND PLAN: 79 y/o female here for reassessment of the following issues:  Bowel habit irregularity / family history of colon cancer / uterine cancer - intermittent loose stools, mild, longstanding. Negative stool workup and celiac testing in 2016, prior colonoscopies have not tested for microscopic colitis per review of chart. She was recently diagnosed with endometrial cancer, has family history of cancers as above, waiting on [place] for treatment, anticipates hysterectomy soon. She is asking for another colonoscopy. She has never had a adenomatous polyp in her life and her mother was diagnosed with colon cancer at a later age. There are some other cancers in her family, defer to oncology on if she requires any further genetic testing, but reassured her the risk of her getting colon cancer at this time is low based on prior exam findings. We discussed that many patients stop having screening procedures after  the age of 33. She is anxious about her endometrial cancer and wants to have another colonoscopy done. The (+) FOBT test could have been due to vaginal bleeding, but given that and her chronic loose stools, colonoscopy may be reasonable to rule out microscopic colitis and to evaluate based on her symptoms. She wishes to get her endometrial cancer evaluation done soon and proceed with therapy for that first. She will contact me when she is ready to proceed with colonoscopy at some point in time. She can use immodium PRN in the interim for loose stools.   Darling Cellar, MD  [facility]

## 2019-10-12 DIAGNOSIS — C541 Malignant neoplasm of endometrium: Secondary | ICD-10-CM | POA: Diagnosis not present

## 2019-10-12 DIAGNOSIS — Z510 Encounter for antineoplastic radiation therapy: Secondary | ICD-10-CM | POA: Diagnosis not present

## 2019-10-12 DIAGNOSIS — R197 Diarrhea, unspecified: Secondary | ICD-10-CM | POA: Diagnosis not present

## 2019-10-12 DIAGNOSIS — T66XXXS Radiation sickness, unspecified, sequela: Secondary | ICD-10-CM | POA: Diagnosis not present

## 2019-10-19 DIAGNOSIS — C541 Malignant neoplasm of endometrium: Secondary | ICD-10-CM | POA: Diagnosis not present

## 2019-10-19 DIAGNOSIS — T66XXXS Radiation sickness, unspecified, sequela: Secondary | ICD-10-CM | POA: Diagnosis not present

## 2019-10-19 DIAGNOSIS — R197 Diarrhea, unspecified: Secondary | ICD-10-CM | POA: Diagnosis not present

## 2019-10-19 DIAGNOSIS — Z510 Encounter for antineoplastic radiation therapy: Secondary | ICD-10-CM | POA: Diagnosis not present

## 2019-10-22 DIAGNOSIS — R197 Diarrhea, unspecified: Secondary | ICD-10-CM | POA: Diagnosis not present

## 2019-10-22 DIAGNOSIS — T66XXXS Radiation sickness, unspecified, sequela: Secondary | ICD-10-CM | POA: Diagnosis not present

## 2019-10-22 DIAGNOSIS — C541 Malignant neoplasm of endometrium: Secondary | ICD-10-CM | POA: Diagnosis not present

## 2019-10-22 DIAGNOSIS — Z510 Encounter for antineoplastic radiation therapy: Secondary | ICD-10-CM | POA: Diagnosis not present

## 2019-10-23 ENCOUNTER — Ambulatory Visit: Payer: Medicare Other | Attending: Internal Medicine

## 2019-10-23 DIAGNOSIS — Z23 Encounter for immunization: Secondary | ICD-10-CM | POA: Insufficient documentation

## 2019-10-23 NOTE — Progress Notes (Signed)
   Covid-19 Vaccination Clinic  Name:  Jillian [name]    MRN: [id number] DOB: [birth year]  10/23/2019  Jillian [name] was observed post Covid-19 immunization for 15 minutes without incidence. She was provided with Vaccine Information Sheet and instruction to access the V-Safe system.   Jillian [name] was instructed to call 911 with any severe reactions post vaccine: Marland Kitchen Difficulty breathing  . Swelling of your face and throat  . A fast heartbeat  . A bad rash all over your body  . Dizziness and weakness    Immunizations Administered    Name Date Dose VIS Date Route   Pfizer COVID-19 Vaccine 10/23/2019 11:08 AM 0.3 mL 09/18/2019 Intramuscular   Manufacturer: [place]   Lot: [id number]   [place]: [id number]

## 2019-10-30 DIAGNOSIS — C541 Malignant neoplasm of endometrium: Secondary | ICD-10-CM | POA: Diagnosis not present

## 2019-10-30 DIAGNOSIS — K219 Gastro-esophageal reflux disease without esophagitis: Secondary | ICD-10-CM | POA: Diagnosis not present

## 2019-10-30 DIAGNOSIS — R300 Dysuria: Secondary | ICD-10-CM | POA: Diagnosis not present

## 2019-10-30 DIAGNOSIS — I10 Essential (primary) hypertension: Secondary | ICD-10-CM | POA: Diagnosis not present

## 2019-11-05 DIAGNOSIS — M545 Low back pain: Secondary | ICD-10-CM | POA: Diagnosis not present

## 2019-11-05 DIAGNOSIS — Z0000 Encounter for general adult medical examination without abnormal findings: Secondary | ICD-10-CM | POA: Diagnosis not present

## 2019-11-05 DIAGNOSIS — Z23 Encounter for immunization: Secondary | ICD-10-CM | POA: Diagnosis not present

## 2019-11-05 DIAGNOSIS — M546 Pain in thoracic spine: Secondary | ICD-10-CM | POA: Diagnosis not present

## 2019-11-05 DIAGNOSIS — M8580 Other specified disorders of bone density and structure, unspecified site: Secondary | ICD-10-CM | POA: Diagnosis not present

## 2019-11-05 DIAGNOSIS — I10 Essential (primary) hypertension: Secondary | ICD-10-CM | POA: Diagnosis not present

## 2019-11-10 ENCOUNTER — Ambulatory Visit: Payer: Self-pay

## 2019-11-10 ENCOUNTER — Ambulatory Visit: Payer: Medicare PPO

## 2019-11-11 DIAGNOSIS — C541 Malignant neoplasm of endometrium: Secondary | ICD-10-CM | POA: Diagnosis not present

## 2019-11-12 ENCOUNTER — Encounter: Payer: Self-pay | Admitting: Gastroenterology

## 2019-11-13 DIAGNOSIS — Z1231 Encounter for screening mammogram for malignant neoplasm of breast: Secondary | ICD-10-CM | POA: Diagnosis not present

## 2019-11-19 DIAGNOSIS — C541 Malignant neoplasm of endometrium: Secondary | ICD-10-CM | POA: Diagnosis not present

## 2019-11-20 ENCOUNTER — Ambulatory Visit: Payer: Medicare PPO | Attending: Internal Medicine

## 2019-11-20 DIAGNOSIS — Z23 Encounter for immunization: Secondary | ICD-10-CM

## 2019-11-20 NOTE — Progress Notes (Signed)
   Covid-19 Vaccination Clinic  Name:  Jillian [name]    MRN: [id number] DOB: [birth year]  11/20/2019  [name] was observed post Covid-19 immunization for 15 minutes without incidence. She was provided with Vaccine Information Sheet and instruction to access the V-Safe system.   [name] was instructed to call 911 with any severe reactions post vaccine: Marland Kitchen Difficulty breathing  . Swelling of your face and throat  . A fast heartbeat  . A bad rash all over your body  . Dizziness and weakness    Immunizations Administered    Name Date Dose VIS Date Route   Pfizer COVID-19 Vaccine 11/20/2019 11:10 AM 0.3 mL 09/18/2019 Intramuscular   Manufacturer: [place]   Lot: [id number]   [place]: [id number]

## 2019-11-23 ENCOUNTER — Other Ambulatory Visit: Payer: Self-pay

## 2019-11-23 ENCOUNTER — Ambulatory Visit (AMBULATORY_SURGERY_CENTER): Payer: Self-pay

## 2019-11-23 ENCOUNTER — Telehealth: Payer: Self-pay

## 2019-11-23 VITALS — Temp 96.6°F | Ht 64.0 in | Wt 127.6 lb

## 2019-11-23 DIAGNOSIS — Z86010 Personal history of colonic polyps: Secondary | ICD-10-CM

## 2019-11-23 MED ORDER — NA SULFATE-K SULFATE-MG SULF 17.5-3.13-1.6 GM/177ML PO SOLN: 1.0000 | Freq: Once | ORAL | 0 refills | Status: AC

## 2019-11-23 NOTE — Progress Notes (Addendum)
No egg or soy allergy known to patient  No issues with past sedation with any surgeries  or procedures, no intubation problems  No diet pills per patient No home 02 use per patient  No blood thinners per patient  Pt denies issues with constipation  No A fib or A flutter  EMMI video sent to pt's e mail   Due to the COVID-19 pandemic we are asking patients to follow these guidelines. Please only bring one care partner. Please be aware that your care partner may wait in the car in the parking lot or if they feel like they will be too hot to wait in the car, they may wait in the lobby on the 4th floor. All care partners are required to wear a mask the entire time (we do not have any that we can provide them), they need to practice social distancing, and we will do a Covid check for all patient's and care partners when you arrive. Also we will check their temperature and your temperature. If the care partner waits in their car they need to stay in the parking lot the entire time and we will call them on their cell phone when the patient is ready for discharge so they can bring the car to the front of the building. Also all patient's will need to wear a mask into building.  TE to Dr. Havery Moros r/t pt's completing chemo and radiation less than 1 yr

## 2019-11-23 NOTE — Telephone Encounter (Signed)
Dr Havery Moros:  Jillian [name] pt in [place] today.  She informed us that she has recently completed her Radiation on  10/22/2019 and her Chemo on 07/20/2019 for endometrial CA.  Pt states oncologist told her she was ok for colon for March 2021 or later.  She is scheduled for a colon 3/1 at 11:00 AM for hx of polyps and [facility] (mother).  Is it ok for her to proceed with colon as scheduled.    Thanks for your time.

## 2019-11-24 NOTE — Telephone Encounter (Signed)
Chart reviewed. Yes okay to directly proceed with colonoscopy at the [facility] in March or later.  Diagnosis for the exam is (+) FOBT, change in bowel habits, family history of colon cancer. The patient does not have any history of polyps. Thanks

## 2019-11-24 NOTE — Telephone Encounter (Signed)
Pt notified of Dr.Armbrusters recommendations.

## 2019-11-27 DIAGNOSIS — C541 Malignant neoplasm of endometrium: Secondary | ICD-10-CM | POA: Diagnosis not present

## 2019-11-30 ENCOUNTER — Encounter: Payer: Self-pay | Admitting: Gastroenterology

## 2019-12-07 ENCOUNTER — Encounter: Payer: Self-pay | Admitting: Gastroenterology

## 2019-12-07 ENCOUNTER — Other Ambulatory Visit: Payer: Self-pay

## 2019-12-07 ENCOUNTER — Ambulatory Visit (AMBULATORY_SURGERY_CENTER): Payer: Medicare PPO | Admitting: Gastroenterology

## 2019-12-07 VITALS — BP 126/69 | HR 70 | Temp 97.1°F | Resp 14 | Ht 64.0 in | Wt 127.0 lb

## 2019-12-07 DIAGNOSIS — D123 Benign neoplasm of transverse colon: Secondary | ICD-10-CM | POA: Diagnosis not present

## 2019-12-07 DIAGNOSIS — R195 Other fecal abnormalities: Secondary | ICD-10-CM

## 2019-12-07 DIAGNOSIS — I10 Essential (primary) hypertension: Secondary | ICD-10-CM | POA: Diagnosis not present

## 2019-12-07 DIAGNOSIS — R194 Change in bowel habit: Secondary | ICD-10-CM

## 2019-12-07 DIAGNOSIS — Z860100 Personal history of colon polyps, unspecified: Secondary | ICD-10-CM

## 2019-12-07 DIAGNOSIS — D125 Benign neoplasm of sigmoid colon: Secondary | ICD-10-CM

## 2019-12-07 DIAGNOSIS — D122 Benign neoplasm of ascending colon: Secondary | ICD-10-CM | POA: Diagnosis not present

## 2019-12-07 DIAGNOSIS — K219 Gastro-esophageal reflux disease without esophagitis: Secondary | ICD-10-CM | POA: Diagnosis not present

## 2019-12-07 DIAGNOSIS — Z800 Family history of malignant neoplasm of digestive organs: Secondary | ICD-10-CM

## 2019-12-07 DIAGNOSIS — Z1211 Encounter for screening for malignant neoplasm of colon: Secondary | ICD-10-CM | POA: Diagnosis not present

## 2019-12-07 MED ORDER — SODIUM CHLORIDE 0.9 % IV SOLN: 500.0000 mL | Freq: Once | INTRAVENOUS | Status: DC

## 2019-12-07 NOTE — Patient Instructions (Signed)
Information on polyps and diverticulosis given to you today.  Await pathology results.  YOU HAD AN ENDOSCOPIC PROCEDURE TODAY AT THE [facility]:   Refer to the procedure report that was given to you for any specific questions about what was found during the examination.  If the procedure report does not answer your questions, please call your gastroenterologist to clarify.  If you requested that your care partner not be given the details of your procedure findings, then the procedure report has been included in a sealed envelope for you to review at your convenience later.  YOU SHOULD EXPECT: Some feelings of bloating in the abdomen. Passage of more gas than usual.  Walking can help get rid of the air that was put into your GI tract during the procedure and reduce the bloating. If you had a lower endoscopy (such as a colonoscopy or flexible sigmoidoscopy) you may notice spotting of blood in your stool or on the toilet paper. If you underwent a bowel prep for your procedure, you may not have a normal bowel movement for a few days.  Please Note:  You might notice some irritation and congestion in your nose or some drainage.  This is from the oxygen used during your procedure.  There is no need for concern and it should clear up in a day or so.  SYMPTOMS TO REPORT IMMEDIATELY:   Following lower endoscopy (colonoscopy or flexible sigmoidoscopy):  Excessive amounts of blood in the stool  Significant tenderness or worsening of abdominal pains  Swelling of the abdomen that is new, acute  Fever of 100F or higher   For urgent or emergent issues, a gastroenterologist can be reached at any hour by calling [phone].   DIET:  We do recommend a small meal at first, but then you may proceed to your regular diet.  Drink plenty of fluids but you should avoid alcoholic beverages for 24 hours.  ACTIVITY:  You should plan to take it easy for the rest of today and you should NOT DRIVE or use  heavy machinery until tomorrow (because of the sedation medicines used during the test).    FOLLOW UP: Our staff will call the number listed on your records 48-72 hours following your procedure to check on you and address any questions or concerns that you may have regarding the information given to you following your procedure. If we do not reach you, we will leave a message.  We will attempt to reach you two times.  During this call, we will ask if you have developed any symptoms of COVID 19. If you develop any symptoms (ie: fever, flu-like symptoms, shortness of breath, cough etc.) before then, please call [phone].  If you test positive for Covid 19 in the 2 weeks post procedure, please call and report this information to us.    If any biopsies were taken you will be contacted by phone or by letter within the next 1-3 weeks.  Please call us at [phone] if you have not heard about the biopsies in 3 weeks.    SIGNATURES/CONFIDENTIALITY: You and/or your care partner have signed paperwork which will be entered into your electronic medical record.  These signatures attest to the fact that that the information above on your After Visit Summary has been reviewed and is understood.  Full responsibility of the confidentiality of this discharge information lies with you and/or your care-partner. 

## 2019-12-07 NOTE — Op Note (Addendum)
[place] Patient Name: Jillian [name] Procedure Date: 12/07/2019 11:03 AM MRN: [id number] Endoscopist: Remo Lipps P. Havery Moros , MD Age: 79 Referring MD:  Date of Birth: 04-May-1941 Gender: Female Account #: [id number] Procedure:                Colonoscopy Indications:              Heme positive stool, Change in bowel habits, family                            history of colon cancer (mother age 66s), personal                            history of endometrial cancer s/p surgery / chemo /                            XRT, testing NEGATIVE for Lynch syndrome per patient Medicines:                Monitored Anesthesia Care Procedure:                Pre-Anesthesia Assessment:                           - Prior to the procedure, a History and Physical                            was performed, and patient medications and                            allergies were reviewed. The patient's tolerance of                            previous anesthesia was also reviewed. The risks                            and benefits of the procedure and the sedation                            options and risks were discussed with the patient.                            All questions were answered, and informed consent                            was obtained. Prior Anticoagulants: The patient has                            taken no previous anticoagulant or antiplatelet                            agents. ASA Grade Assessment: II - A patient with                            mild systemic disease. After reviewing the risks  and benefits, the patient was deemed in                            satisfactory condition to undergo the procedure.                           After obtaining informed consent, the colonoscope                            was passed under direct vision. Throughout the                            procedure, the patient's blood pressure, pulse, and   oxygen saturations were monitored continuously. The                            Colonoscope was introduced through the anus and                            advanced to the the terminal ileum, with                            identification of the appendiceal orifice and IC                            valve. The colonoscopy was performed without                            difficulty. The patient tolerated the procedure                            well. The quality of the bowel preparation was                            good. The terminal ileum, ileocecal valve,                            appendiceal orifice, and rectum were photographed. Scope In: 11:14:32 AM Scope Out: 11:35:55 AM Scope Withdrawal Time: 0 hours 18 minutes 16 seconds  Total Procedure Duration: 0 hours 21 minutes 23 seconds  Findings:                 The perianal and digital rectal examinations were                            normal other than a benign appearing small                            subcutaneous cyst.                           The terminal ileum appeared normal.                           A 5 mm polyp was found in the ascending colon. The  polyp was sessile. The polyp was removed with a                            cold snare. Resection and retrieval were complete.                           A 4 mm polyp was found in the splenic flexure. The                            polyp was sessile. The polyp was removed with a                            cold snare. Resection and retrieval were complete.                           A 4 mm polyp was found in the sigmoid colon. The                            polyp was sessile. The polyp was removed with a                            cold snare. Resection and retrieval were complete.                           Multiple medium-mouthed diverticula were found in                            the sigmoid colon and ascending colon.                           Anal papilla(e)  were hypertrophied.                           The exam was otherwise without abnormality.                           Biopsies for histology were taken with a cold                            forceps from the right colon, left colon and                            transverse colon for evaluation of microscopic                            colitis. Complications:            No immediate complications. Estimated blood loss:                            Minimal. Estimated Blood Loss:     Estimated blood loss was minimal. Impression:               - The examined portion of the ileum was normal.                           -  One 5 mm polyp in the ascending colon, removed                            with a cold snare. Resected and retrieved.                           - One 4 mm polyp at the splenic flexure, removed                            with a cold snare. Resected and retrieved.                           - One 4 mm polyp in the sigmoid colon, removed with                            a cold snare. Resected and retrieved.                           - Diverticulosis in the sigmoid colon and in the                            ascending colon.                           - Anal papilla(e) were hypertrophied.                           - The examination was otherwise normal.                           - Biopsies were taken with a cold forceps from the                            right colon, left colon and transverse colon for                            evaluation of microscopic colitis. Recommendation:           - Patient has a contact number available for                            emergencies. The signs and symptoms of potential                            delayed complications were discussed with the                            patient. Return to normal activities tomorrow.                            Written discharge instructions were provided to the                            patient.                            -  Resume previous diet.                           - Continue present medications.                           - Await pathology results. Remo Lipps P. Aceyn Kathol, MD 12/07/2019 11:41:28 AM This report has been signed electronically.

## 2019-12-07 NOTE — Progress Notes (Signed)
Report given to PACU, vss 

## 2019-12-07 NOTE — Progress Notes (Signed)
Called to room to assist during endoscopic procedure.  Patient ID and intended procedure confirmed with present staff. Received instructions for my participation in the procedure from the performing physician.  

## 2019-12-07 NOTE — Progress Notes (Signed)
Temp-LC VS-KA  Pt's states no medical or surgical changes since previsit or office visit.

## 2019-12-09 ENCOUNTER — Telehealth: Payer: Self-pay

## 2019-12-09 NOTE — Telephone Encounter (Signed)
  Follow up Call-  Call back number 12/07/2019  Post procedure Call Back phone  # [phone]  Permission to leave phone message Yes  Some recent data might be hidden     Patient questions:  Do you have a fever, pain , or abdominal swelling? No. Pain Score  0 *  Have you tolerated food without any problems? Yes.    Have you been able to return to your normal activities? Yes.    Do you have any questions about your discharge instructions: Diet   No. Medications  No. Follow up visit  No.  Do you have questions or concerns about your Care? No.  Actions: * If pain score is 4 or above: No action needed, pain <4.  1. Have you developed a fever since your procedure? no  2.   Have you had an respiratory symptoms (SOB or cough) since your procedure? no  3.   Have you tested positive for COVID 19 since your procedure no  4.   Have you had any family members/close contacts diagnosed with the COVID 19 since your procedure?  no   If yes to any of these questions please route to Joylene John, RN and Alphonsa Gin, Therapist, sports.

## 2019-12-25 DIAGNOSIS — C541 Malignant neoplasm of endometrium: Secondary | ICD-10-CM | POA: Diagnosis not present

## 2020-01-20 ENCOUNTER — Telehealth: Payer: Self-pay | Admitting: Gastroenterology

## 2020-01-20 NOTE — Telephone Encounter (Signed)
Patient reports abdominal pain and diarrhea.  She will come in for appt on 02/24/20.  She will hold her citrucel and try taking the imodium in the am when she wakes then PRN.

## 2020-01-20 NOTE — Telephone Encounter (Signed)
Left message for patient to call back  

## 2020-01-20 NOTE — Telephone Encounter (Signed)
Patient is having a lot of abdominal pain she is taking Citracel and imodium but its not helping please advise

## 2020-01-25 ENCOUNTER — Encounter: Payer: Self-pay | Admitting: Physician Assistant

## 2020-01-25 ENCOUNTER — Ambulatory Visit: Payer: Medicare PPO | Admitting: Physician Assistant

## 2020-01-25 VITALS — BP 106/64 | HR 76 | Temp 98.9°F | Ht 64.0 in | Wt 131.4 lb

## 2020-01-25 DIAGNOSIS — R197 Diarrhea, unspecified: Secondary | ICD-10-CM | POA: Diagnosis not present

## 2020-01-25 DIAGNOSIS — R1032 Left lower quadrant pain: Secondary | ICD-10-CM

## 2020-01-25 DIAGNOSIS — C541 Malignant neoplasm of endometrium: Secondary | ICD-10-CM | POA: Diagnosis not present

## 2020-01-25 MED ORDER — DICYCLOMINE HCL 10 MG PO CAPS: 10.0000 mg | ORAL_CAPSULE | Freq: Two times a day (BID) | ORAL | 6 refills | Status: DC

## 2020-01-25 NOTE — Progress Notes (Signed)
Subjective:    Patient ID: Jillian [name], female    DOB: 31-Oct-1940, 79 y.o.   MRN: [id number]  HPI [name] is a very nice 79 year old white female, established with Dr. Havery Moros, who comes in today with complaints of recurrent episodes of nausea and diarrhea which are frequently occurring at nighttime.  She had onset of symptoms a little over a month ago. Patient was diagnosed with endometrial cancer last year, and underwent surgical resection, chemotherapy and radiation at [facility].  She just completed radiation in January.  She says she had some of the similar symptoms while she was undergoing radiation, but symptoms have worsened over the past month or so. She says she is having an episode at least a couple of times a week with diarrhea and nausea which wake her up.  She has also developed left lower quadrant abdominal pain and tenderness.  She had an episode last night but says she ate a lot of food last evening for her birthday and was not surprised by that episode.  She has been using Imodium which is helpful but she may not have a bowel movement for couple of days after an episode.  She says these episodes have been urgent and have been associated with incontinence on occasion.  She will have symptoms sometimes during the daytime as well.  She has had a lot of rumbling in her abdomen, and has had intermittent pain in the periumbilical area as well.  She had MRI of the pelvis in March 2021.  Report reviewed today which did show some thickening of the recto sigmoid colon probably secondary to radiation.  There is also a 2.5 cm cystic lesion along the left pelvic sidewall, likely a lymphocele with no substantial change, no suspicious lymphadenopathy.  There is some asymmetric soft tissue thickening along the left aspect of the vaginal cuff measuring 1.3 x 0.8 cm not substantially changed. She is scheduled for PET scan later this week  Patient also related that she has been involved in an Alzheimer's  study through [facility] over the past couple of years.  She was given the option about a year ago to continue with the study knowing that she was getting the active drug.  She paused this while she was undergoing chemotherapy and radiation and restarted infusions in early March.  She receives an infusion once a month of some type of immunotherapy/biologic. She cannot relate her episodes of nausea and diarrhea thus far to the infusions.   Review of Systems Pertinent positive and negative review of systems were noted in the above HPI section.  All other review of systems was otherwise negative.  Outpatient Encounter Medications as of 01/25/2020  Medication Sig  . ipratropium (ATROVENT) 0.03 % nasal spray   . Methylcellulose, Laxative, (CITRUCEL PO) Take by mouth.  . olmesartan (BENICAR) 40 MG tablet Take 40 mg by mouth daily.  . pantoprazole (PROTONIX) 40 MG tablet Take 40 mg by mouth See admin instructions. Every 4 days  . pyridOXINE (B-6) 50 MG tablet Take by mouth.  . dicyclomine (BENTYL) 10 MG capsule Take 1 capsule (10 mg total) by mouth 2 (two) times daily.  Marland Kitchen EPINEPHrine 0.3 mg/0.3 mL IJ SOAJ injection as needed.   No facility-administered encounter medications on file as of 01/25/2020.   No Known Allergies Patient Active Problem List   Diagnosis Date Noted  . Endometrial cancer ([place]) 01/25/2020  . Postmenopausal bleeding 03/04/2019  . ABDOMINAL PAIN, UNSPECIFIED SITE 08/20/2008  . DUODENITIS 08/17/2008  .  DIVERTICULOSIS, COLON 08/17/2008  . ABDOMINAL BLOATING 08/17/2008  . DIARRHEA 08/17/2008  . GASTRITIS, HX OF 08/17/2008   Social History   Socioeconomic History  . Marital status: Married    Spouse name: Not on file  . Number of children: Not on file  . Years of education: Not on file  . Highest education level: Not on file  Occupational History  . Occupation: Retired  Tobacco Use  . Smoking status: Former Smoker    Quit date: 10/08/1990    Years since quitting: 29.3  .  Smokeless tobacco: Never Used  Substance and Sexual Activity  . Alcohol use: Yes    Alcohol/week: 7.0 standard drinks    Types: 7 Glasses of wine per week    Comment: glass of wine at night   . Drug use: No  . Sexual activity: Not Currently    Birth control/protection: Surgical  Other Topics Concern  . Not on file  Social History Narrative  . Not on file   Social Determinants of Health   Financial Resource Strain:   . Difficulty of Paying Living Expenses:   Food Insecurity:   . Worried About Charity fundraiser in the Last Year:   . Arboriculturist in the Last Year:   Transportation Needs:   . Film/video editor (Medical):   Marland Kitchen Lack of Transportation (Non-Medical):   Physical Activity:   . Days of Exercise per Week:   . Minutes of Exercise per Session:   Stress:   . Feeling of Stress :   Social Connections:   . Frequency of Communication with Friends and Family:   . Frequency of Social Gatherings with Friends and Family:   . Attends Religious Services:   . Active Member of Clubs or Organizations:   . Attends Archivist Meetings:   Marland Kitchen Marital Status:   Intimate Partner Violence:   . Fear of Current or Ex-Partner:   . Emotionally Abused:   Marland Kitchen Physically Abused:   . Sexually Abused:     Ms. Copeman family history includes Alzheimer's disease in her father and mother; Breast cancer in her maternal grandmother; Colon cancer in her maternal uncle; Colon cancer (age of onset: 64) in her mother; Endometrial cancer in her mother; Heart disease in her mother; Ovarian cancer in her maternal grandmother.      Objective:    Vitals:   01/25/20 1333  BP: 106/64  Pulse: 76  Temp: 98.9 F (37.2 C)    Physical Exam Well-developed well-nourished  Elderly female in no acute distress.  Height, [id number], BMI 22.5  HEENT; nontraumatic normocephalic, EOMI, PER R LA, sclera anicteric. Oropharynx; not examined Neck; supple, no JVD Cardiovascular; regular rate and  rhythm with S1-S2, no murmur rub or gallop Pulmonary; Clear bilaterally Abdomen; soft, there is tenderness in the left lower quadrant/mid quadrant, nondistended, no palpable mass or hepatosplenomegaly, bowel sounds are active Rectal; not done today Skin; benign exam, no jaundice rash or appreciable lesions Extremities; no clubbing cyanosis or edema skin warm and dry Neuro/Psych; alert and oriented x4, grossly nonfocal mood and affect appropriate       Assessment & Plan:   #67 79 year old female with 4 to 6-week history of frequent episodes of nausea, abdominal pain and diarrhea, frequently occurring at nighttime with at least 2 episodes per week.  She is experiencing left lower quadrant tenderness and intermittent periumbilical pain.  Diarrhea is urgent, occasionally associated with incontinence.  Onset of current symptoms after completing radiation  therapy of the pelvis for endometrial cancer and January 2021.  She had undergone surgical resection and chemotherapy in 2020.  She is also on a study drug for Alzheimer's which she receives an infusion once monthly.  She restarted these about a month and a half ago and has had 2 infusions thus far.  Recent pelvic MRI showed a 2.5 cm cystic lesion along the left pelvic sidewall likely a lymphocele, there is probably also thickening of the rectosigmoid colon which may be related to radiation.  I think it is likely that her symptoms are related to recent radiation therapy with probable radiation-induced colopathy.  #2 history of adenomatous colon polyps-patient just had colonoscopy 1 month ago. #3 GERD stable  Plan; start trial of dicyclomine 10 mg p.o. twice daily.  Advised her to take 1 dose prior to bedtime and a dose in early a.m.  Hopefully this will help avert some of the nocturnal episodes. Start Florastor twice daily x1 month or natures bounty 10 1 p.o. daily. Office follow-up with Dr. Havery Moros or myself in 1 month.  Can review PET scan  at that time.  Patient was advised to call in a week or so if she is not having any improvement in symptoms with a trial of antispasmodic.  Cullen Vanallen Genia Harold PA-C 01/25/2020   Cc: Deland Pretty, MD

## 2020-01-25 NOTE — Patient Instructions (Addendum)
If you are age 80 or older, your body mass index should be between 23-30. Your Body mass index is 22.55 kg/m. If this is out of the aforementioned range listed, please consider follow up with your Primary Care Provider.  If you are age 21 or younger, your body mass index should be between 19-25. Your Body mass index is 22.55 kg/m. If this is out of the aformentioned range listed, please consider follow up with your Primary Care Provider.   We have sent the following medications to your pharmacy for you to pick up at your convenience: Bentyl 10 mg twice daily for abdominal pain/diarrhea.   Try Florastor twice daily or Natures Bounty Probiotic 10 daily.

## 2020-01-26 ENCOUNTER — Encounter: Payer: Self-pay | Admitting: Physician Assistant

## 2020-01-28 DIAGNOSIS — C541 Malignant neoplasm of endometrium: Secondary | ICD-10-CM | POA: Diagnosis not present

## 2020-02-01 ENCOUNTER — Telehealth: Payer: Self-pay | Admitting: Physician Assistant

## 2020-02-02 ENCOUNTER — Ambulatory Visit: Payer: Medicare PPO | Admitting: Gastroenterology

## 2020-02-02 NOTE — Telephone Encounter (Signed)
Please see if Glycopyrolate 2mg  po BID  will be at a reasonable cost for her - if so then send rx  #60 /6 refills - definitely less sedating - be sure she knows it is  to be taken only twice daily

## 2020-02-02 NOTE — Telephone Encounter (Signed)
Left a message to call 

## 2020-02-03 ENCOUNTER — Other Ambulatory Visit: Payer: Self-pay

## 2020-02-03 MED ORDER — GLYCOPYRROLATE 2 MG PO TABS: 2.0000 mg | ORAL_TABLET | Freq: Two times a day (BID) | ORAL | 3 refills | Status: DC

## 2020-02-03 NOTE — Telephone Encounter (Signed)
Spoke with the patient. She is taking the Bentyl twice daily and has to nap in the middle of the day. She has had an otherwise positive response to it. Her diarrhea has improved. She is interested in trying the alternative. Understands that Glycopyrrolate is to be taken no more than 2 times daily.  Pharmacy confirmed. She will call with any problems.

## 2020-02-10 DIAGNOSIS — C541 Malignant neoplasm of endometrium: Secondary | ICD-10-CM | POA: Diagnosis not present

## 2020-02-10 DIAGNOSIS — Z9221 Personal history of antineoplastic chemotherapy: Secondary | ICD-10-CM | POA: Diagnosis not present

## 2020-02-10 DIAGNOSIS — Z08 Encounter for follow-up examination after completed treatment for malignant neoplasm: Secondary | ICD-10-CM | POA: Diagnosis not present

## 2020-02-10 DIAGNOSIS — Z8542 Personal history of malignant neoplasm of other parts of uterus: Secondary | ICD-10-CM | POA: Diagnosis not present

## 2020-02-10 DIAGNOSIS — Z923 Personal history of irradiation: Secondary | ICD-10-CM | POA: Diagnosis not present

## 2020-02-26 ENCOUNTER — Encounter: Payer: Self-pay | Admitting: Gastroenterology

## 2020-02-26 ENCOUNTER — Ambulatory Visit (INDEPENDENT_AMBULATORY_CARE_PROVIDER_SITE_OTHER): Payer: Medicare PPO | Admitting: Gastroenterology

## 2020-02-26 VITALS — BP 118/64 | HR 70 | Ht 64.0 in | Wt 130.0 lb

## 2020-02-26 DIAGNOSIS — K219 Gastro-esophageal reflux disease without esophagitis: Secondary | ICD-10-CM | POA: Diagnosis not present

## 2020-02-26 DIAGNOSIS — R194 Change in bowel habit: Secondary | ICD-10-CM

## 2020-02-26 DIAGNOSIS — R109 Unspecified abdominal pain: Secondary | ICD-10-CM

## 2020-02-26 NOTE — Progress Notes (Signed)
HPI :  79 y/o female here for reassessment. She has chronic loose stools, mother with colon cancer.  She has a history of endometrial cancer and underwent surgical resection, chemotherapy and radiation at [facility].  She just completed radiation in January of 2021.   MRI of the pelvis in March 2021.  Report reviewed today which did show some thickening of the recto sigmoid colon probably secondary to radiation.  There is also a 2.5 cm cystic lesion along the left pelvic sidewall, likely a lymphocele with no substantial change, no suspicious lymphadenopathy.  There is some asymmetric soft tissue thickening along the left aspect of the vaginal cuff measuring 1.3 x 0.8 cm not substantially changed   PET scan 01/28/20 - Linear FDG uptake in right aspect of vaginal cuff could be treatment related. No uptake in the left aspect of the vaginal cuff in the area of asymmetry noted on cross-sectional imaging. Attention on follow-up imaging. No FDG avid adenopathy. 2. Short segment increased FDG uptake and sigmoid colon. Correlate with results of recent colonoscopy. 3. Increased FDG uptake at the tip of port A catheter could be secondary to fibrin sheath/clot. Correlate with port functionality. 4. Additional CT findings as above.   Colonoscopy 12/07/19 -  The examined portion of the ileum was normal. - One 5 mm polyp in the ascending colon, removed with a cold snare. Resected and retrieved. - One 4 mm polyp at the splenic flexure, removed with a cold snare. Resected and retrieved. - One 4 mm polyp in the sigmoid colon, removed with a cold snare. Resected and retrieved. - Diverticulosis in the sigmoid colon and in the ascending colon. - Anal papilla(e) were hypertrophied. - The examination was otherwise normal. - Biopsies were taken with a cold forceps from the right colon, left colon and transverse colon for evaluation of microscopic colitis.  Diagnosis 1. Surgical [P], random colon - COLONIC  MUCOSA WITH NO SIGNIFICANT HISTOPATHOLOGIC CHANGES. - NO MICROSCOPIC COLITIS, ACTIVE INFLAMMATION OR GRANULOMAS. 2. Surgical [P], colon, ascending, splenic flexure, sigmoid, polyp (3) - TUBULAR ADENOMA(S). - NO HIGH GRADE DYSPLASIA OR CARCINOMA.   She has some variable loose stools at times which has been longstanding.  She is also had some intermittent abdominal cramps. She remotely had a negative celiac workup in 2016 and stool studies.  She previously took Citrucel with some variable success but had not been working more recently.  Her colonoscopy recently did not show any evidence of colitis or microscopic colitis.  She was given a trial of Bentyl which she states makes her slightly sleepy but definitely helps her cramps and should be doing much better with this.  She was given a trial of Robinul which significantly cause dry mouth and she did not like that and stopped it.  Currently she has loose stools perhaps 1 to 2 days a week, she will have a few loose stools in a day but otherwise the rest of the week she feels okay without any persistent diarrhea.  She is not been taking any Citrucel recently.  She used Imodium and Lomotil in the past but not lately.  She does not have any clear triggers to her symptoms.  She had been taking it probiotic but stopped it and wonders if she should use it again.  Generally she is feeling much better than she did at the last visit.  Her PET scan in April showed a very short segment of uptake in her sigmoid colon however colonoscopy was just done in  March and did not show any concerning findings in the left colon other than some diverticulosis.  She asks about regimen for reflux.  She has been on Pepcid in the past which caused heart palpitations and Dexilant as well which worked for her.  She has been on pantoprazole as needed over the past few years, she takes it once every 4 days and states it helps when she takes it but she is having a bit more symptoms  recently.  We discussed options.  No dysphagia   Patient reports her mother had colon cancer, she was diagnosed age 59-80s. She also had endometrial cancer at age 39. Her grandmother had ovarian cancer. Aunt had breast cancer   Prior screening exams: Colonoscopy 02/2014 - sigmoid diverticulosis, rectal hyperplastic polyps, no inflammatory changes Colonoscopy 12/2008 - no polyps, diverticulosis Colonoscopy 12/2003 - normal, diverticulosis  Colonoscopy 12/07/19 -  The examined portion of the ileum was normal. - One 5 mm polyp in the ascending colon, removed with a cold snare. Resected and retrieved. - One 4 mm polyp at the splenic flexure, removed with a cold snare. Resected and retrieved. - One 4 mm polyp in the sigmoid colon, removed with a cold snare. Resected and retrieved. - Diverticulosis in the sigmoid colon and in the ascending colon. - Anal papilla(e) were hypertrophied. - The examination was otherwise normal. - Biopsies were taken with a cold forceps from the right colon, left colon and transverse colon for evaluation of microscopic colitis.  Diagnosis 1. Surgical [P], random colon - COLONIC MUCOSA WITH NO SIGNIFICANT HISTOPATHOLOGIC CHANGES. - NO MICROSCOPIC COLITIS, ACTIVE INFLAMMATION OR GRANULOMAS. 2. Surgical [P], colon, ascending, splenic flexure, sigmoid, polyp (3) - TUBULAR ADENOMA(S). - NO HIGH GRADE DYSPLASIA OR CARCINOMA.   Past Medical History:  Diagnosis Date  . Allergy   . Arthritis   . Cataract    Surgery 2014  . Endometrial cancer ([place]) 02/2019  . GERD (gastroesophageal reflux disease)   . Heart murmur    per pt  . Hypertension   . Osteoporosis    Osteopenia     Past Surgical History:  Procedure Laterality Date  . BIOPSY ENDOMETRIAL  02/2019   endometrium carcinoma  . BREAST ENHANCEMENT SURGERY Bilateral 2006   and face lift  . CATARACT EXTRACTION Bilateral 10/2012, 01/2013  . COLONOSCOPY     02/10/2014  . KNEE ARTHROSCOPY WITH EXCISION BAKER'S  CYST Left 1990  . LAPAROSCOPIC HYSTERECTOMY    . TUBAL LIGATION  1977  . UPPER GASTROINTESTINAL ENDOSCOPY  1990s  . WISDOM TOOTH EXTRACTION  1990   Family History  Problem Relation Age of Onset  . Alzheimer's disease Mother   . Colon cancer Mother 46  . Endometrial cancer Mother   . Heart disease Mother   . Alzheimer's disease Father   . Ovarian cancer Maternal Grandmother   . Breast cancer Maternal Grandmother   . Colon cancer Maternal Uncle   . Esophageal cancer Neg Hx   . Stomach cancer Neg Hx   . Prostate cancer Neg Hx   . Rectal cancer Neg Hx    Social History   Tobacco Use  . Smoking status: Former Smoker    Quit date: 10/08/1990    Years since quitting: 29.4  . Smokeless tobacco: Never Used  Substance Use Topics  . Alcohol use: Yes    Alcohol/week: 7.0 standard drinks    Types: 7 Glasses of wine per week    Comment: glass of wine at night   .  Drug use: No   Current Outpatient Medications  Medication Sig Dispense Refill  . dicyclomine (BENTYL) 10 MG capsule Take 1 capsule (10 mg total) by mouth 2 (two) times daily. 60 capsule 6  . EPINEPHrine 0.3 mg/0.3 mL IJ SOAJ injection as needed.    Marland Kitchen ipratropium (ATROVENT) 0.03 % nasal spray     . Lactobacillus (PROBIOTIC ACIDOPHILUS) TABS Take 1 capsule by mouth daily.    Marland Kitchen olmesartan (BENICAR) 40 MG tablet Take 40 mg by mouth daily.    . pantoprazole (PROTONIX) 40 MG tablet Take 40 mg by mouth See admin instructions. Every 4 days     No current facility-administered medications for this visit.   No Known Allergies   Review of Systems: All systems reviewed and negative except where noted in HPI.   Lab Results  Component Value Date   WBC 5.8 03/02/2019   HGB 13.8 03/02/2019   HCT 41.2 03/02/2019   MCV 93.8 03/02/2019   PLT 222 03/02/2019    Lab Results  Component Value Date   CREATININE 1.09 (H) 03/02/2019   BUN 20 03/02/2019   NA 139 03/02/2019   K 3.8 03/02/2019   CL 105 03/02/2019   CO2 22 03/02/2019     Lab Results  Component Value Date   ALT 16 03/02/2019   AST 20 03/02/2019   ALKPHOS 53 03/02/2019   BILITOT 0.4 03/02/2019     Physical Exam: BP 118/64   Pulse 70   Ht 5\' 4"  (1.626 m)   Wt 130 lb (59 kg)   BMI 22.31 kg/m  Constitutional: Pleasant,well-developed, female in no acute distress. HEENT: Normocephalic and atraumatic. Conjunctivae are normal. No scleral icterus. Neck supple.  Abdominal: Soft, nondistended, nontender.  There are no masses palpable.  Extremities: no edema Lymphadenopathy: No cervical adenopathy noted. Neurological: Alert and oriented to person place and time. Skin: Skin is warm and dry. No rashes noted. Psychiatric: Normal mood and affect. Behavior is normal.   ASSESSMENT AND PLAN: 79 year old female here for reassessment the following issues:  Altered bowel habits / abdominal cramps - longstanding symptoms, suspect functional bowel disorder that probably got worse in the setting of recent radiation.  Her PET scan showed some focal uptake in the sigmoid colon however she just had a colonoscopy 1 month prior to the PET scan which showed no abnormalities in her colon.  Biopsies negative for microscopic colitis.  Her labs are normal. she was started on Bentyl and is doing much better since she was last here.  Cramps are no longer bothering her and only having occasional loose stool here there.  I told her she can use some Imodium as needed, may want to start with half tab every morning and titrate up as needed.  She can follow-up as needed for this issue as long as she is continue to improve.  I think she can stay off the probiotic at this time given she is doing well (she has already run out of it)  GERD - using Protonix 40 mg as needed, on average once every 4 days, she is having some increased symptoms recently and told her she can use this a bit more frequently if she would like including once daily.  She can follow-up as needed if it is not  helping.  [place]ville Cellar, MD [facility]

## 2020-04-18 DIAGNOSIS — R300 Dysuria: Secondary | ICD-10-CM | POA: Diagnosis not present

## 2020-04-18 DIAGNOSIS — C541 Malignant neoplasm of endometrium: Secondary | ICD-10-CM | POA: Diagnosis not present

## 2020-04-21 DIAGNOSIS — M8589 Other specified disorders of bone density and structure, multiple sites: Secondary | ICD-10-CM | POA: Diagnosis not present

## 2020-04-21 DIAGNOSIS — M810 Age-related osteoporosis without current pathological fracture: Secondary | ICD-10-CM | POA: Diagnosis not present

## 2020-05-04 IMAGING — CT CT ABDOMEN AND PELVIS WITH CONTRAST
2 of 5 series · 16 of 46 positions shown, 18 images · IV contrast (omnipaque)
Comparison: None.

CLINICAL DATA: Vaginal bleeding

EXAM:
CT ABDOMEN AND PELVIS WITH CONTRAST
TECHNIQUE: Multidetector CT imaging of the abdomen and pelvis was performed
using the standard protocol following bolus administration of
intravenous contrast.
CONTRAST:  100mL OMNIPAQUE IOHEXOL 300 MG/ML  SOLN

[Series 3: abd/ pelvis 5.0 i30f 2 · axial · 0.85mm/px · z∈[+684,+1144]mm · 13 of 104 slices shown, 15 images]
[im 6/104  soft-tissue]
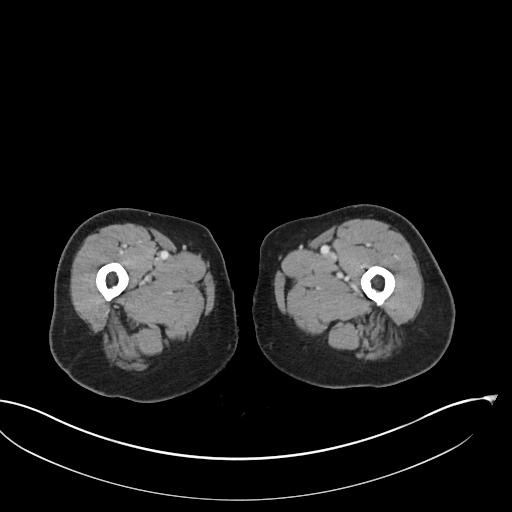
[im 6/104  bone]
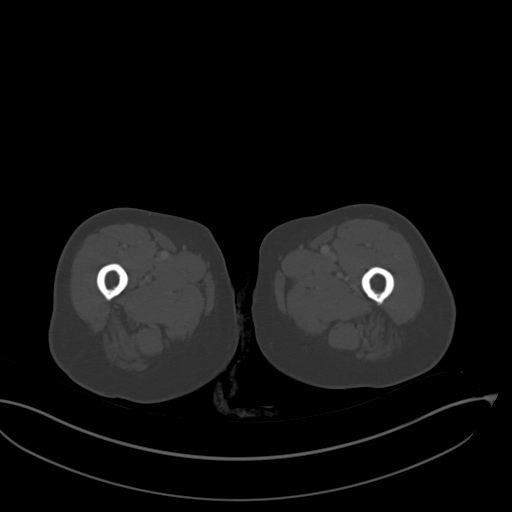
[im 16/104  soft-tissue]
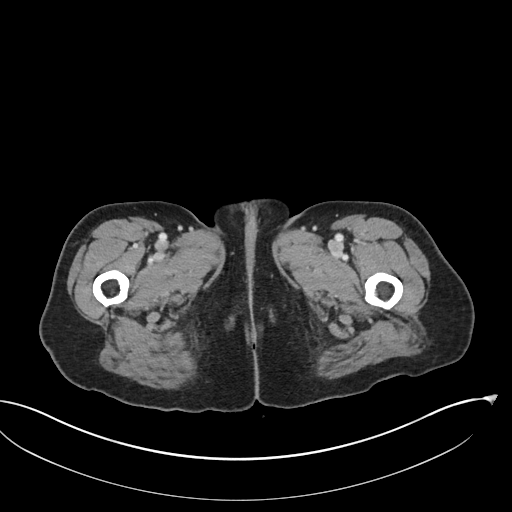
[im 21/104  soft-tissue]
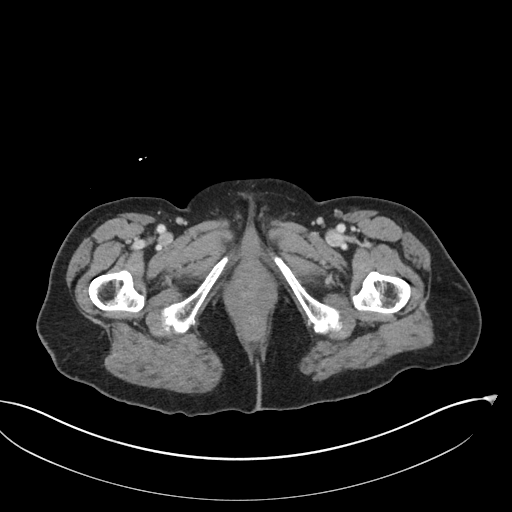
[im 31/104  soft-tissue]
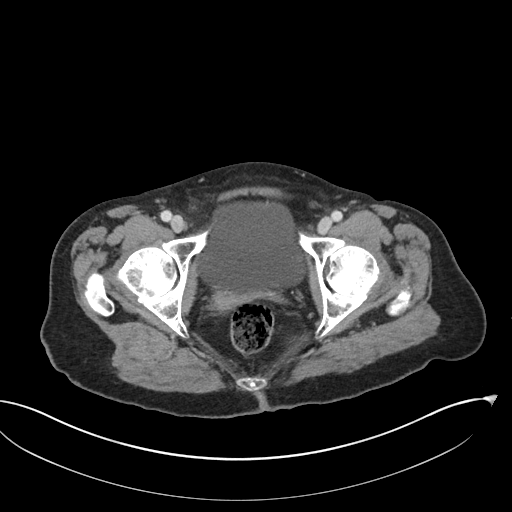
[im 37/104  soft-tissue]
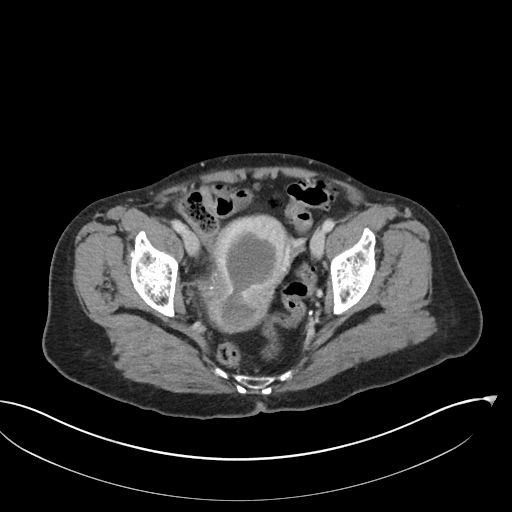
[im 47/104  soft-tissue]
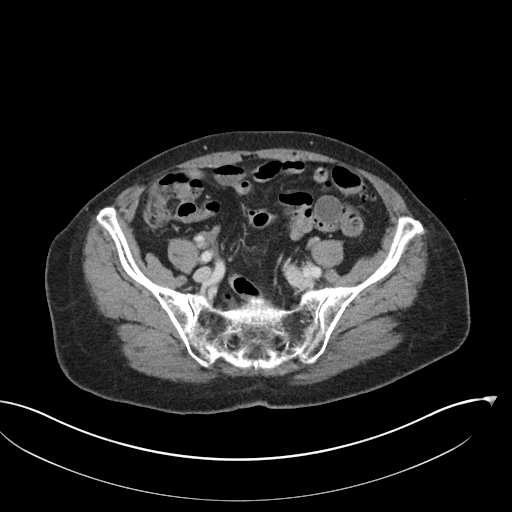
[im 52/104  soft-tissue]
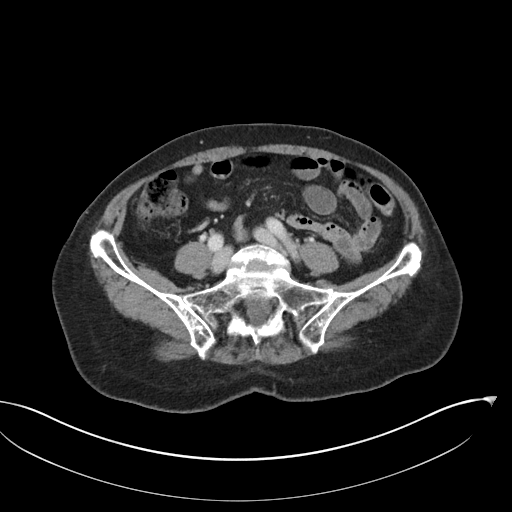
[im 57/104  soft-tissue]
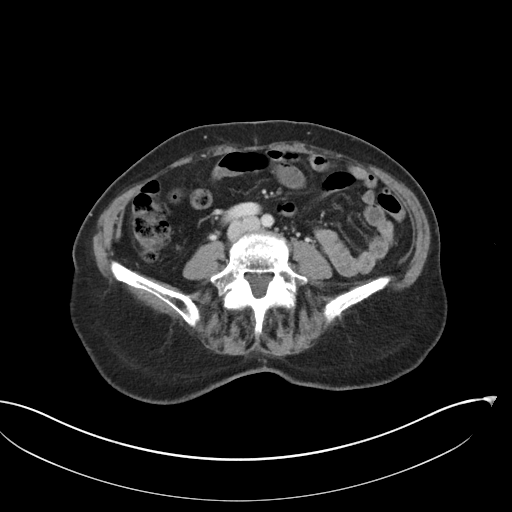
[im 67/104  soft-tissue]
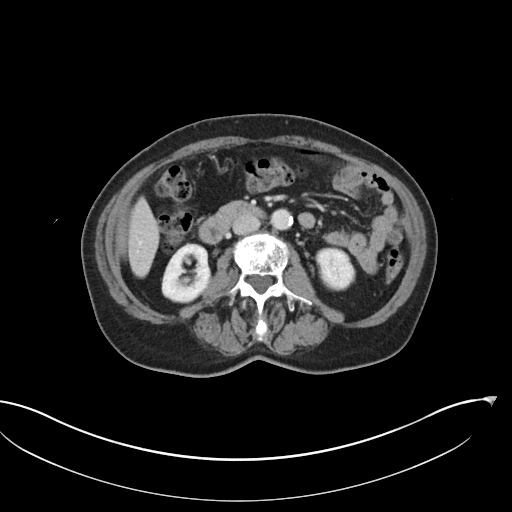
[im 67/104  bone]
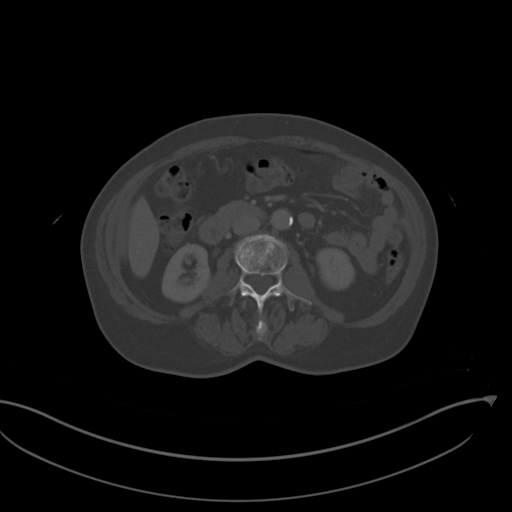
[im 73/104  soft-tissue]
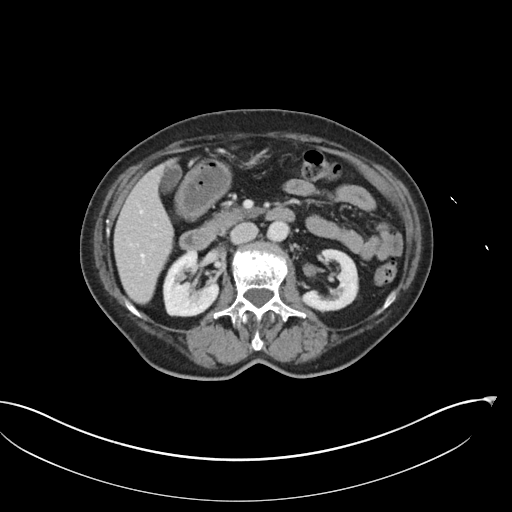
[im 83/104  soft-tissue]
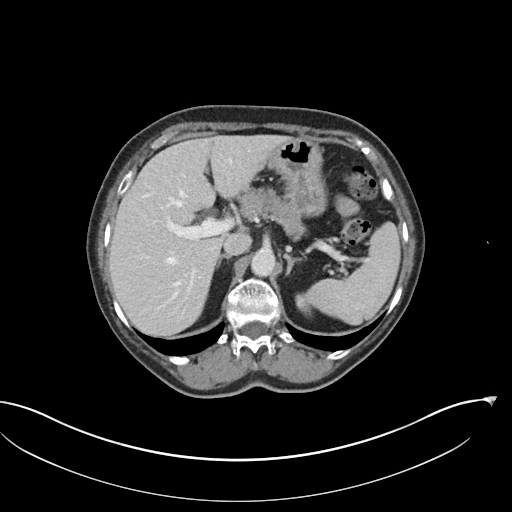
[im 88/104  soft-tissue]
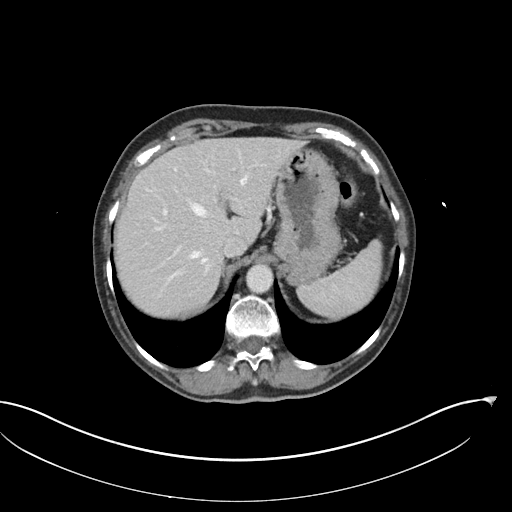
[im 98/104  soft-tissue]
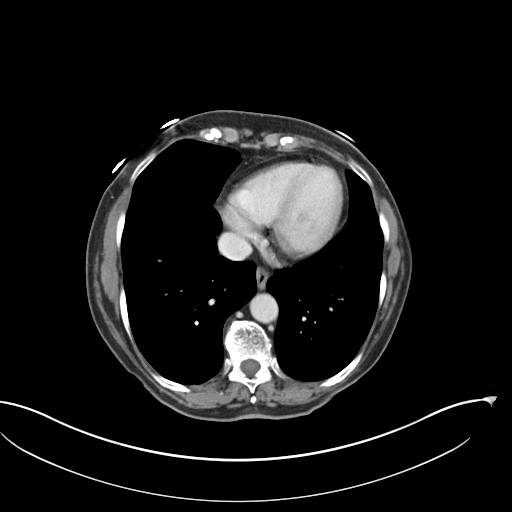

[Series 6: coronal soft tissue · coronal · 0.85mm/px · 3 of 102 slices shown]
[im 34/102  soft-tissue]
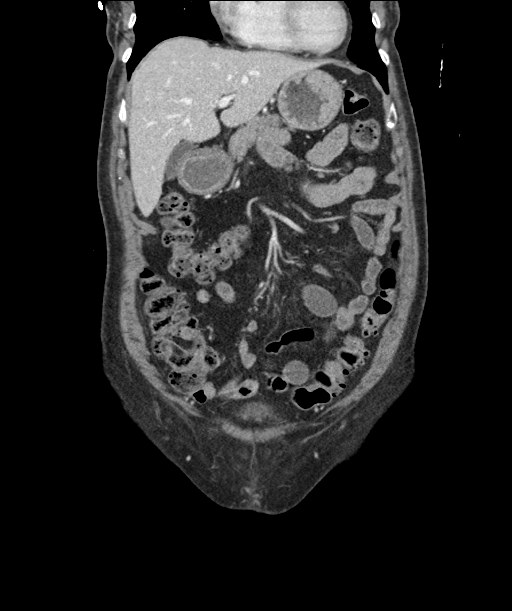
[im 45/102  soft-tissue]
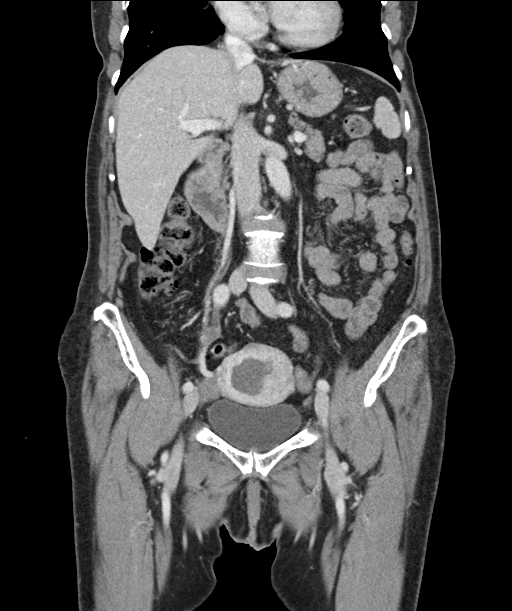
[im 57/102  soft-tissue]
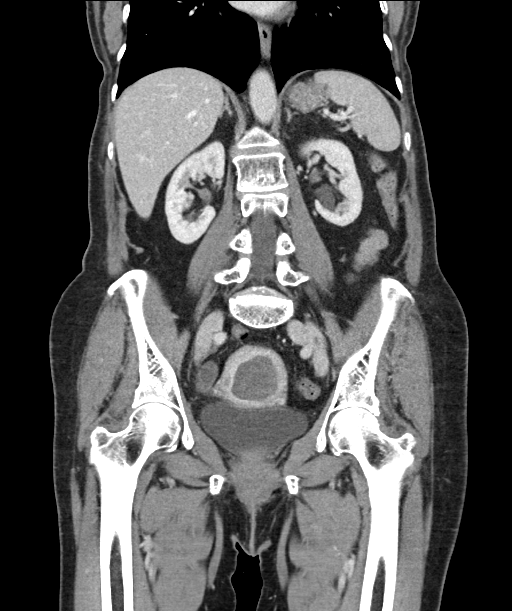

[16 of 46 positions shown; findings below may reference images not displayed]

FINDINGS: Lower chest: There is a 5 mm pulmonary nodule in the right lower
lobe (axial series 3, image 1). The heart size is unremarkable.

Hepatobiliary: No focal liver abnormality is seen. No gallstones,
gallbladder wall thickening, or biliary dilatation.

Pancreas: Unremarkable. No pancreatic ductal dilatation or
surrounding inflammatory changes.

Spleen: Normal in size without focal abnormality.

Adrenals/Urinary Tract: Adrenal glands are unremarkable. Kidneys are
normal, without renal calculi, focal lesion, or hydronephrosis.
Bladder is unremarkable.

Stomach/Bowel: There is scattered colonic diverticula without CT
evidence of diverticulitis. There is no evidence of a small-bowel
obstruction. The appendix is not reliably identified, however there
are no significant inflammatory changes in the right lower quadrant.
The stomach is unremarkable.

Vascular/Lymphatic: Aortic atherosclerosis. No enlarged abdominal or
pelvic lymph nodes.

Reproductive: The endometrial canal is diffusely dilated measuring
approximately 4.2 cm. There is fluid in the lower uterine segment.
Multiple hypoattenuating nodules are noted in the myometrium
measuring up to 2 cm. There is no ovarian mass.

Other: No abdominal wall hernia or abnormality. No abdominopelvic
ascites.

Musculoskeletal: No acute or significant osseous findings.
IMPRESSION: 1. Fluid-filled and dilated endometrial canal. There are multiple
small hypoattenuating nodules in the myometrium. Findings are highly
suspicious for underlying malignancy in a postmenopausal patient.
Gynecologic follow-up is recommended. There are no pathologically
enlarged lymph nodes identified on today's exam.
2. Small 5 mm pulmonary nodule in the right lower lobe. Follow-up is
recommended. Non-contrast chest CT can be considered in 12 months if
patient is high-risk. This recommendation follows the consensus
statement: Guidelines for Management of Incidental Pulmonary Nodules
Detected on CT Images: From the [HOSPITAL] 9066; Radiology

## 2020-05-09 DIAGNOSIS — R300 Dysuria: Secondary | ICD-10-CM | POA: Diagnosis not present

## 2020-05-16 DIAGNOSIS — R933 Abnormal findings on diagnostic imaging of other parts of digestive tract: Secondary | ICD-10-CM | POA: Diagnosis not present

## 2020-05-16 DIAGNOSIS — R918 Other nonspecific abnormal finding of lung field: Secondary | ICD-10-CM | POA: Diagnosis not present

## 2020-05-16 DIAGNOSIS — C541 Malignant neoplasm of endometrium: Secondary | ICD-10-CM | POA: Diagnosis not present

## 2020-05-16 DIAGNOSIS — Z483 Aftercare following surgery for neoplasm: Secondary | ICD-10-CM | POA: Diagnosis not present

## 2020-05-21 DIAGNOSIS — Z20822 Contact with and (suspected) exposure to covid-19: Secondary | ICD-10-CM | POA: Diagnosis not present

## 2020-05-21 DIAGNOSIS — R509 Fever, unspecified: Secondary | ICD-10-CM | POA: Diagnosis not present

## 2020-05-21 DIAGNOSIS — R519 Headache, unspecified: Secondary | ICD-10-CM | POA: Diagnosis not present

## 2020-05-24 ENCOUNTER — Telehealth: Payer: Self-pay | Admitting: Gastroenterology

## 2020-05-24 ENCOUNTER — Encounter (HOSPITAL_COMMUNITY): Payer: Self-pay | Admitting: *Deleted

## 2020-05-24 ENCOUNTER — Other Ambulatory Visit: Payer: Self-pay

## 2020-05-24 ENCOUNTER — Emergency Department (HOSPITAL_COMMUNITY): Payer: Medicare PPO

## 2020-05-24 ENCOUNTER — Emergency Department (HOSPITAL_COMMUNITY)
Admission: EM | Admit: 2020-05-24 | Discharge: 2020-05-24 | Disposition: A | Discharge status: Home / Self Care | Payer: Medicare PPO | Attending: Emergency Medicine | Admitting: Emergency Medicine

## 2020-05-24 DIAGNOSIS — Z90710 Acquired absence of both cervix and uterus: Secondary | ICD-10-CM | POA: Diagnosis not present

## 2020-05-24 DIAGNOSIS — K5730 Diverticulosis of large intestine without perforation or abscess without bleeding: Secondary | ICD-10-CM | POA: Diagnosis not present

## 2020-05-24 DIAGNOSIS — I700 Atherosclerosis of aorta: Secondary | ICD-10-CM | POA: Diagnosis not present

## 2020-05-24 DIAGNOSIS — K529 Noninfective gastroenteritis and colitis, unspecified: Secondary | ICD-10-CM | POA: Diagnosis not present

## 2020-05-24 DIAGNOSIS — I10 Essential (primary) hypertension: Secondary | ICD-10-CM | POA: Insufficient documentation

## 2020-05-24 DIAGNOSIS — Z79899 Other long term (current) drug therapy: Secondary | ICD-10-CM | POA: Insufficient documentation

## 2020-05-24 DIAGNOSIS — R197 Diarrhea, unspecified: Secondary | ICD-10-CM | POA: Diagnosis present

## 2020-05-24 DIAGNOSIS — K5190 Ulcerative colitis, unspecified, without complications: Secondary | ICD-10-CM | POA: Diagnosis not present

## 2020-05-24 LAB — COMPREHENSIVE METABOLIC PANEL
ALT: 23 U/L (ref 0–44)
AST: 17 U/L (ref 15–41)
Albumin: 3.2 g/dL — ABNORMAL LOW (ref 3.5–5.0)
Alkaline Phosphatase: 46 U/L (ref 38–126)
Anion gap: 9 (ref 5–15)
BUN: 21 mg/dL (ref 8–23)
CO2: 22 mmol/L (ref 22–32)
Calcium: 8.9 mg/dL (ref 8.9–10.3)
Chloride: 108 mmol/L (ref 98–111)
Creatinine, Ser: 1.21 mg/dL — ABNORMAL HIGH (ref 0.44–1.00)
GFR calc Af Amer: 49 mL/min — ABNORMAL LOW (ref >60)
GFR calc non Af Amer: 42 mL/min — ABNORMAL LOW (ref >60)
Glucose, Bld: 119 mg/dL — ABNORMAL HIGH (ref 70–99)
Potassium: 3.4 mmol/L — ABNORMAL LOW (ref 3.5–5.1)
Sodium: 139 mmol/L (ref 135–145)
Total Bilirubin: 0.8 mg/dL (ref 0.3–1.2)
Total Protein: 6.6 g/dL (ref 6.5–8.1)

## 2020-05-24 LAB — CBC
HCT: 34.6 % — ABNORMAL LOW (ref 36.0–46.0)
Hemoglobin: 11.7 g/dL — ABNORMAL LOW (ref 12.0–15.0)
MCH: 32.8 pg (ref 26.0–34.0)
MCHC: 33.8 g/dL (ref 30.0–36.0)
MCV: 96.9 fL (ref 80.0–100.0)
Platelets: 171 10*3/uL (ref 150–400)
RBC: 3.57 MIL/uL — ABNORMAL LOW (ref 3.87–5.11)
RDW: 12.5 % (ref 11.5–15.5)
WBC: 7.8 10*3/uL (ref 4.0–10.5)
nRBC: 0 % (ref 0.0–0.2)

## 2020-05-24 LAB — LIPASE, BLOOD: Lipase: 22 U/L (ref 11–51)

## 2020-05-24 LAB — C DIFFICILE QUICK SCREEN W PCR REFLEX
C Diff antigen: POSITIVE — AB
C Diff interpretation: DETECTED
C Diff toxin: POSITIVE — AB

## 2020-05-24 MED ORDER — CIPROFLOXACIN HCL 500 MG PO TABS: 500.0000 mg | ORAL_TABLET | Freq: Two times a day (BID) | ORAL | 0 refills | Status: DC

## 2020-05-24 MED ORDER — METRONIDAZOLE 500 MG PO TABS
500.0000 mg | ORAL_TABLET | Freq: Three times a day (TID) | ORAL | 0 refills | Status: DC
Start: 2020-05-24 — End: 2020-05-25

## 2020-05-24 MED ORDER — IOHEXOL 300 MG/ML  SOLN
80.0000 mL | Freq: Once | INTRAMUSCULAR | Status: AC | PRN
  Administered 2020-05-24: 80 mL via INTRAVENOUS

## 2020-05-24 MED ORDER — SODIUM CHLORIDE 0.9 % IV BOLUS
1000.0000 mL | Freq: Once | INTRAVENOUS | Status: AC
  Administered 2020-05-24: 1000 mL via INTRAVENOUS

## 2020-05-24 NOTE — Discharge Instructions (Signed)
Your work-up today showed that you have pretty severe colitis.  Please make sure to take the antibiotic as directed until finished.  Drink plenty of fluids.  You may try to start reintroducing foods that are included in the bland diet.  I have attached a handout with some examples.  Please make sure to follow-up with your stomach doctor about the symptoms you are experiencing today.  Return to the ER if you have worsening fevers, bloody diarrhea, worsening abdominal pain, etc.  Your stool was tested today, if this is positive and there needs to be a change in your antibiotic regimen, you will receive a call.  You may also check your results on MyChart.  Your CT scan also did note a possible necrotic lymph node in the your pelvic region.  Please make sure to follow-up with your OB/GYN oncologist about this finding.  This may require additional imaging.

## 2020-05-24 NOTE — Telephone Encounter (Signed)
I returned call.  Husband states that she is in so much pain, he has taken her to the emergency room.  He thanked me for the call.

## 2020-05-24 NOTE — ED Notes (Signed)
Pt discharged from ED in NAD  

## 2020-05-24 NOTE — ED Triage Notes (Addendum)
Friday woke up with 103 fever and headache.  Pt had COVID test and it was negative.  PT states Saturday fever returned and reports unable to control bowels, reports it is yellow and mucus. Not eating very much. Pt reports lower abdominal pain. Finished Augmentin last Tuesday for a positive urine culture

## 2020-05-24 NOTE — Telephone Encounter (Signed)
Thanks Sheri, agree that with fever and severe pain she should go to the ED

## 2020-05-24 NOTE — ED Provider Notes (Addendum)
Jillian [name]   CSN: [id number] Arrival date & time: 05/24/20  1950     History Chief Complaint  Patient presents with  . Fever  . Abdominal Pain    Jillian [name] is a 79 y.o. female.  HPI 79 year old female with history of arthritis, endometrial cancer, GERD, hypertension, osteoporosis presents to the ER with several day history of diarrhea, lower abdominal pain, and fevers.  History provided by the patient.  She reports that she saw a urologist on August 9, had a cystoscopy done.  Was found to have a UTI, and placed on Augmentin.  She states she finished the Augmentin several days ago, and started to develop a low-grade fever.  Went to urgent care and got a Covid test which was negative.  By then she started to have frequent but formed bowel movements.  However the next day, she slowly started to develop bowel incontinence with loose yellow diarrhea with mucus.  Denies any hematochezia.  Has had some nausea but no vomiting.  Endorses intermittent no abdominal pain.  No cough, chest pain or shortness of breath.  No back pain.  Has had poor p.o. intake.  Reports fevers as high as 103.  States she has some dysuria but this is chronic secondary to the radiation from her endometrial cancer.  Has been vaccinated for Covid.  Has not taken anything for symptoms.     Past Medical History:  Diagnosis Date  . Allergy   . Arthritis   . Cataract    Surgery 2014  . Endometrial cancer ([place]) 02/2019  . GERD (gastroesophageal reflux disease)   . Heart murmur    per pt  . Hypertension   . Osteoporosis    Osteopenia    Patient Active Problem List   Diagnosis Date Noted  . Endometrial cancer ([place]) 01/25/2020  . Postmenopausal bleeding 03/04/2019  . ABDOMINAL PAIN, UNSPECIFIED SITE 08/20/2008  . DUODENITIS 08/17/2008  . DIVERTICULOSIS, COLON 08/17/2008  . ABDOMINAL BLOATING 08/17/2008  . DIARRHEA 08/17/2008  . GASTRITIS, HX OF 08/17/2008     Past Surgical History:  Procedure Laterality Date  . BIOPSY ENDOMETRIAL  02/2019   endometrium carcinoma  . BREAST ENHANCEMENT SURGERY Bilateral 2006   and face lift  . CATARACT EXTRACTION Bilateral 10/2012, 01/2013  . COLONOSCOPY     02/10/2014  . KNEE ARTHROSCOPY WITH EXCISION BAKER'S CYST Left 1990  . LAPAROSCOPIC HYSTERECTOMY    . TUBAL LIGATION  1977  . UPPER GASTROINTESTINAL ENDOSCOPY  1990s  . WISDOM TOOTH EXTRACTION  1990     OB History    Gravida  3   Para  2   Term  2   Preterm      AB  1   Living  2     SAB  1   TAB      Ectopic      Multiple      Live Births  2           Family History  Problem Relation Age of Onset  . Alzheimer's disease Mother   . Colon cancer Mother 10  . Endometrial cancer Mother   . Heart disease Mother   . Alzheimer's disease Father   . Ovarian cancer Maternal Grandmother   . Breast cancer Maternal Grandmother   . Colon cancer Maternal Uncle   . Esophageal cancer Neg Hx   . Stomach cancer Neg Hx   . Prostate cancer Neg Hx   .  Rectal cancer Neg Hx     Social History   Tobacco Use  . Smoking status: Former Smoker    Quit date: 10/08/1990    Years since quitting: 29.6  . Smokeless tobacco: Never Used  Vaping Use  . Vaping Use: Never used  Substance Use Topics  . Alcohol use: Yes    Alcohol/week: 7.0 standard drinks    Types: 7 Glasses of wine per week    Comment: glass of wine at night   . Drug use: No    Home Medications Prior to Admission medications   Medication Sig Start Date End Date Taking? Authorizing Provider  calcium carbonate (CALCIUM 600) 1500 (600 Ca) MG TABS tablet Take 1,200 mg by mouth daily.   Yes [provider]  cholecalciferol (VITAMIN D3) 25 MCG (1000 UNIT) tablet Take 1,000 Units by mouth daily.   Yes [provider]  ciprofloxacin (CIPRO) 500 MG tablet Take 1 tablet (500 mg total) by mouth 2 (two) times daily for 7 days. 05/24/20 05/31/20  Garald Balding, PA-C   dicyclomine (BENTYL) 10 MG capsule Take 1 capsule (10 mg total) by mouth 2 (two) times daily. 01/25/20   Esterwood, Amy S, PA-C  EPINEPHrine 0.3 mg/0.3 mL IJ SOAJ injection as needed. 01/15/20   [provider]  ipratropium (ATROVENT) 0.03 % nasal spray Place 2 sprays into both nostrils daily as needed.  02/02/14   [provider]  Lactobacillus (PROBIOTIC ACIDOPHILUS) TABS Take 1 capsule by mouth daily. Patient not taking: Reported on 05/24/2020    [provider]  metroNIDAZOLE (FLAGYL) 500 MG tablet Take 1 tablet (500 mg total) by mouth 3 (three) times daily. 05/24/20   Garald Balding, PA-C  olmesartan (BENICAR) 40 MG tablet Take 40 mg by mouth daily.    [provider]  pantoprazole (PROTONIX) 40 MG tablet Take 40 mg by mouth See admin instructions. Every 3 to 4 days    [provider]    Allergies    Patient has no known allergies.  Review of Systems   Review of Systems  Constitutional: Positive for fever. Negative for chills.  HENT: Negative for ear pain and sore throat.   Eyes: Negative for pain and visual disturbance.  Respiratory: Negative for cough and shortness of breath.   Cardiovascular: Negative for chest pain and palpitations.  Gastrointestinal: Positive for abdominal pain, diarrhea and nausea. Negative for blood in stool and vomiting.  Genitourinary: Negative for dysuria and hematuria.  Musculoskeletal: Negative for arthralgias and back pain.  Skin: Negative for color change and rash.  Neurological: Negative for seizures and syncope.  All other systems reviewed and are negative.   Physical Exam Updated Vital Signs BP (!) 116/57   Pulse 85   Temp 99.3 F (37.4 C) (Oral)   Resp 16   Ht 5\' 4"  (1.626 m)   Wt 58.5 kg   SpO2 100%   BMI 22.14 kg/m   Physical Exam Vitals and nursing [name] reviewed.  Constitutional:      General: She is not in acute distress.    Appearance: She is well-developed. She is not ill-appearing,  toxic-appearing or diaphoretic.  HENT:     Head: Normocephalic and atraumatic.  Eyes:     Conjunctiva/sclera: Conjunctivae normal.  Cardiovascular:     Rate and Rhythm: Normal rate and regular rhythm.     Heart sounds: Normal heart sounds. No murmur heard.   Pulmonary:     Effort: Pulmonary effort is normal. No respiratory  distress.     Breath sounds: Normal breath sounds.  Abdominal:     Palpations: Abdomen is soft.     Tenderness: There is abdominal tenderness in the right lower quadrant and left lower quadrant. There is no guarding. Negative signs include Murphy's sign and McBurney's sign.  Musculoskeletal:     Cervical back: Neck supple.  Skin:    General: Skin is warm and dry.  Neurological:     Mental Status: She is alert.     ED Results / Procedures / Treatments   Labs (all labs ordered are listed, but only abnormal results are displayed) Labs Reviewed  COMPREHENSIVE METABOLIC PANEL - Abnormal; Notable for the following components:      Result Value   Potassium 3.4 (*)    Glucose, Bld 119 (*)    Creatinine, Ser 1.21 (*)    Albumin 3.2 (*)    GFR calc non Af Amer 42 (*)    GFR calc Af Amer 49 (*)    All other components within normal limits  CBC - Abnormal; Notable for the following components:   RBC 3.57 (*)    Hemoglobin 11.7 (*)    HCT 34.6 (*)    All other components within normal limits  GASTROINTESTINAL PANEL BY PCR, STOOL (REPLACES STOOL CULTURE)  C DIFFICILE QUICK SCREEN W PCR REFLEX  LIPASE, BLOOD  URINALYSIS, ROUTINE W REFLEX MICROSCOPIC    EKG None  Radiology CT ABDOMEN PELVIS W CONTRAST  Result Date: 05/24/2020 CLINICAL DATA:  78 year old female with history of lower abdominal pain for the past 2 days. EXAM: CT ABDOMEN AND PELVIS WITH CONTRAST TECHNIQUE: Multidetector CT imaging of the abdomen and pelvis was performed using the standard protocol following bolus administration of intravenous contrast. CONTRAST:  50mL OMNIPAQUE IOHEXOL 300 MG/ML   SOLN COMPARISON:  CT the abdomen and pelvis 03/03/2019. FINDINGS: Lower chest: Unremarkable. Hepatobiliary: No suspicious cystic or solid hepatic lesions. No intra or extrahepatic biliary ductal dilatation. Gallbladder is normal in appearance. Pancreas: No pancreatic mass. No pancreatic ductal dilatation. No pancreatic or peripancreatic fluid collections or inflammatory changes. Spleen: Unremarkable. Adrenals/Urinary Tract: Bilateral kidneys and adrenal glands are normal in appearance. No hydroureteronephrosis. Urinary bladder is unremarkable in appearance. Stomach/Bowel: Normal appearance of the stomach. No pathologic dilatation of small bowel or colon. Diffuse mural thickening and mucosal hyperenhancement throughout the colon, compatible with an acute colitis. Mild haziness in the pericolonic fat diffusely, and mild hypervascularity in the associated mesocolon. A few scattered colonic diverticulae are noted, without discrete surrounding inflammatory changes to clearly indicate an acute diverticulitis at this time. Normal appendix. Vascular/Lymphatic: Aortic atherosclerosis, without evidence of aneurysm or dissection in the abdominal or pelvic vasculature. Along the left pelvic sidewall (axial image 63 of series 3 and coronal image 103 of series 6) there is a well-defined 2.4 x 1.8 x 3.2 cm low-attenuation lesion which may represent an enlarged necrotic lymph node, or other benign lesion such as a postoperative lymphocele. No other lymphadenopathy noted in the abdomen or pelvis. Reproductive: Status post hysterectomy. Ovaries are not confidently identified may be surgically absent or atrophic. Other: No significant volume of ascites.  No pneumoperitoneum. Musculoskeletal: There are no aggressive appearing lytic or blastic lesions noted in the visualized portions of the skeleton. IMPRESSION: 1. Imaging findings are compatible with severe colitis, as above. 2. There is also evidence of colonic diverticulosis without  definitive findings to suggest an acute diverticulitis at this time. 3. Status post hysterectomy compared to the prior study. Notably, there  is a new low-attenuation lesion along the left pelvic sidewall. This may simply represent a postoperative lymphocele. However, the possibility of an enlarged necrotic lymph node warrants consideration given the patient's history of endometrial carcinoma. Further clinical evaluation by Gyne-Onc is recommended. At the very least, short-term follow-up CT of the abdomen and pelvis with contrast is recommended in 2-3 months to ensure the stability or resolution of this finding. Alternatively, PET-CT could be considered if clinically appropriate. 4. Aortic atherosclerosis. Electronically Signed   By: Vinnie Langton M.D.   On: 05/24/2020 14:23    Procedures Procedures (including critical care time)  Medications Ordered in ED Medications  sodium chloride 0.9 % bolus 1,000 mL (1,000 mLs Intravenous Bolus from Bag 05/24/20 1246)  iohexol (OMNIPAQUE) 300 MG/ML solution 80 mL (80 mLs Intravenous Contrast Given 05/24/20 1351)    ED Course  I have reviewed the triage vital signs and the nursing notes.  Pertinent labs & imaging results that were available during my care of the patient were reviewed by me and considered in my medical decision making (see chart for details).    MDM Rules/Calculators/A&P                         Patient here with diarrhea, poor p.o. intake, lower abdominal pain x5 days Presentation, patient is alert, oriented, nontoxic-appearing, no acute distress.  Vital signs overall reassuring, patient had received Tylenol in triage, currently borderline temp of 99.3.  Other vitals reassuring.  Physical exam with mild lower abdominal tenderness.  CMP with mild hypokalemia 3.4, creatinine of 1.21 likely elevated in the setting of dehydration.  No other significant electrolyte abnormalities noted.  Normal LFTs.  CBC without leukocytosis.  Mild anemia with a  hemoglobin of 11.7.  Lipase normal.  CT of the abdomen with severe colitis, questionable necrotic lymph node.   Partook in shared decision making, patient would like to attempt oral antibiotics in an outpatient setting.  I think this is reasonable. Unable to obtain UA as the patient has been having concomitant urination with diarrhea.  Given she was treated with Augmentin recently for UTI, believe she is stable for discharge without UA.  Patient was discharged on Cipro and Flagyl, stool cultures pending.  Strict return precautions discussed, which included worsening nausea, vomiting, abdominal pain, fevers, chills, hematochezia.  Encouraged her to follow-up with her GI doctor.  She voices understanding and is agreeable.  I also encouraged her to follow-up with her GYN onc physician about the questionable necrotic lymph node.  She and her husband voiced understanding.  At this stage in the ED course, the patient is medically screened and stable for discharge.  Patient was seen and evaluated by Dr. Vanita Panda who is agreeable to the above plan and disposition. Final Clinical Impression(s) / ED Diagnoses Final diagnoses:  Colitis    Rx / DC Orders ED Discharge Orders         Ordered    ciprofloxacin (CIPRO) 500 MG tablet  2 times daily     Discontinue  Reprint     05/24/20 1543    metroNIDAZOLE (FLAGYL) 500 MG tablet  3 times daily     Discontinue  Reprint     05/24/20 1543              Garald Balding, PA-C 05/24/20 1702    Carmin Muskrat, MD 05/25/20 1616

## 2020-05-25 ENCOUNTER — Telehealth: Payer: Self-pay | Admitting: Gastroenterology

## 2020-05-25 LAB — GASTROINTESTINAL PANEL BY PCR, STOOL (REPLACES STOOL CULTURE)

## 2020-05-25 MED ORDER — VANCOMYCIN HCL 125 MG PO CAPS: 125.0000 mg | ORAL_CAPSULE | Freq: Four times a day (QID) | ORAL | 0 refills | Status: AC

## 2020-05-25 NOTE — Telephone Encounter (Signed)
Patient reports she was diagnosed with C-diff colitis yesterday in the ED.  CT shows severe colitis.  She was started on Cipro and flagyl.  She is feeling better this am, but still has lower abdominal pain.  She is asking that you review her ED report and CT and advise if she needs any additional tx.  They recommended admission, but she choose to go home for outpatient antibiotics.

## 2020-05-25 NOTE — Telephone Encounter (Signed)
Thanks Sheri, Chart reviewed. She was diagnosed with C diff colitis. Would recommend we change her antibiotics - she should take fidaxomycin 200mg  BID for 10 days - OR - vancomycin dosed at 125mg  every 6 hours, for 10 day course initially, whichever is cheaper. She can stop the flagyl. Sounds like she had a UTI recently and took Augmentin. Not sure why they gave her cipro as well, if she has no UTI symptoms would recommend stopping the cipro and treating only the C diff as I think it is driving her symptoms. If she has symptoms of UTI as well then she should call her PCP to see if she needs the cipro but I would recommend avoiding any other antibiotics outside of what we need to treat the C diff right now if possible. If she is not getting better with therapy for C diff she needs to call us back. Thanks

## 2020-05-25 NOTE — Telephone Encounter (Signed)
Patient notified of the new recommendations.  No UTI symptoms.  She will discontinue cipro and flagyl.  Rx for vancomycin sent.  She is advised to isolate to her own bathroom and clean with bleach. She will start on Florastor as well for 6 weeks. She verbalized understanding of all new instructions.

## 2020-05-26 ENCOUNTER — Telehealth: Payer: Self-pay

## 2020-05-26 NOTE — Telephone Encounter (Signed)
Approved until 10-07-2020. Info was faxed to the pharmacy

## 2020-05-26 NOTE — Telephone Encounter (Signed)
PA has been submitted with covermymeds for Vancomycin

## 2020-06-15 DIAGNOSIS — C541 Malignant neoplasm of endometrium: Secondary | ICD-10-CM | POA: Diagnosis not present

## 2020-06-15 DIAGNOSIS — Z23 Encounter for immunization: Secondary | ICD-10-CM | POA: Diagnosis not present

## 2020-06-15 DIAGNOSIS — Z923 Personal history of irradiation: Secondary | ICD-10-CM | POA: Diagnosis not present

## 2020-06-15 DIAGNOSIS — R918 Other nonspecific abnormal finding of lung field: Secondary | ICD-10-CM | POA: Diagnosis not present

## 2020-06-15 DIAGNOSIS — Z9221 Personal history of antineoplastic chemotherapy: Secondary | ICD-10-CM | POA: Diagnosis not present

## 2020-06-16 ENCOUNTER — Telehealth: Payer: Self-pay | Admitting: Gastroenterology

## 2020-06-16 NOTE — Telephone Encounter (Signed)
Patient called states she has questions in reference to her diagnosis and medications. Also mentioned needing a nutritionist

## 2020-06-16 NOTE — Telephone Encounter (Signed)
Spoke with patient she states that she finished medications for C. Diff on 06/05/20, pt reports no diarrhea. She reports history of hemorrhoids and uncomfortable pressure near the rectum if she stands for along period of time, formed stools a couple of times a day - small amount. Pt states that she looked online and found something that stated her Pantoprazole could have caused her C. Diff. Pt states that she was on Augmentin prior, advised that long term use of antibiotics is one way that C. Diff can be developed. Pt advised to continue Pantoprazole as needed for GERD per last office note, reviewed reflux diet, pt wanting a referral for nutritionist advised that this will need to be discussed at office visit after evaluation. Pt also wanting to know if she could restart Bentyl, advised that ok to restart Bentyl if she develops abdominal cramping or spasms. Pt advised to call our office back if she develops diarrhea again or if she has any other concerns prior to her appt, she is scheduled for a follow up on 08/08/20 AT 9:50 am, pt placed on cancellation list.

## 2020-06-23 DIAGNOSIS — R197 Diarrhea, unspecified: Secondary | ICD-10-CM | POA: Diagnosis not present

## 2020-06-23 DIAGNOSIS — A0472 Enterocolitis due to Clostridium difficile, not specified as recurrent: Secondary | ICD-10-CM | POA: Diagnosis not present

## 2020-07-01 ENCOUNTER — Other Ambulatory Visit: Payer: Self-pay

## 2020-07-01 ENCOUNTER — Telehealth: Payer: Self-pay | Admitting: Gastroenterology

## 2020-07-01 DIAGNOSIS — A498 Other bacterial infections of unspecified site: Secondary | ICD-10-CM

## 2020-07-01 MED ORDER — FIDAXOMICIN 200 MG PO TABS: 200.0000 mg | ORAL_TABLET | Freq: Two times a day (BID) | ORAL | 0 refills | Status: DC

## 2020-07-01 NOTE — Telephone Encounter (Signed)
Spoke with patient regarding recommendations. Pt is aware that RX will be sent through [facility] and that they will contact her with next steps. Pt verbalized understanding. RX sent.

## 2020-07-01 NOTE — Telephone Encounter (Signed)
It is certainly possible that the C. difficile was not eradicated or it recurred.  Please prescribe fidaxomycin 200mg  BID for 10 days . thanks

## 2020-07-01 NOTE — Telephone Encounter (Signed)
Dr. Ardis Hughs as DOD on 07/01/20, [place] patient, diagnosed with C. Diff last month, hx of altered bowel habits, and abdominal cramps  Pt reports that she took her last vancomycin on 8/29. She states that she goes to the bathroom about 10 times a day, mostly loose stools not diarrhea, morning BM is pretty normal, pt states that throughout the day she has urgency and rectal pressure, pt reports that sometimes that she'll only pass mucus, pt states that within 15 minutes she will have to have a BM, pt states that she does not have to have a BM in the middle of the night, pt states that she can't tell any difference with Dicyclomine,she states that she is taking Florastor twice a day, no fever, this morning patient had abdominal cramping. Pt states that she was previously doing well on dicyclomine twice a day. Pt is scheduled for a follow up on 07/12/20 at 11 am with Tye Savoy, NP. Please advise on any further recommendations prior to appt, thank you.

## 2020-07-05 ENCOUNTER — Other Ambulatory Visit: Payer: Self-pay

## 2020-07-05 ENCOUNTER — Encounter: Payer: Self-pay | Admitting: Gastroenterology

## 2020-07-05 DIAGNOSIS — A498 Other bacterial infections of unspecified site: Secondary | ICD-10-CM

## 2020-07-05 MED ORDER — FIDAXOMICIN 200 MG PO TABS: 200.0000 mg | ORAL_TABLET | Freq: Two times a day (BID) | ORAL | 0 refills | Status: AC

## 2020-07-05 NOTE — Telephone Encounter (Signed)
error 

## 2020-07-05 NOTE — Telephone Encounter (Signed)
Spoke with patient, she states that [facility] has not contacted her regarding her RX for Dificid. Patient states that she called her insurance company and they reached out to [facility], [facility] states that they will get patient moved up on the list. I told patient that I could send it to [facility], advised patient that I am not sure how much it would cost, pt agreed to have this sent to [facility]. Advised patient to give the pharmacy a call later today to check on the price of the medication and if she finds that it is not affordable she will need to wait on [facility]. Pt states that she feels like she should be taking something. Pt states that she has about 1 week left of Florastor, advised patient that she should continue taking Florastor until after she completes Dificid, once she completes Dificid she should call us with an update to see if the provider would like her to continue on the Florastor. Pt verbalized understanding

## 2020-07-05 NOTE — Telephone Encounter (Signed)
Please return her call.

## 2020-07-05 NOTE — Telephone Encounter (Signed)
Lm on vm for patient to return call 

## 2020-07-05 NOTE — Telephone Encounter (Signed)
Patient called and stated she has been unable to get medication.

## 2020-07-12 ENCOUNTER — Ambulatory Visit (INDEPENDENT_AMBULATORY_CARE_PROVIDER_SITE_OTHER): Payer: Medicare PPO | Admitting: Nurse Practitioner

## 2020-07-12 ENCOUNTER — Encounter: Payer: Self-pay | Admitting: Nurse Practitioner

## 2020-07-12 VITALS — BP 90/60 | HR 92 | Ht 64.0 in | Wt 129.0 lb

## 2020-07-12 DIAGNOSIS — A0472 Enterocolitis due to Clostridium difficile, not specified as recurrent: Secondary | ICD-10-CM | POA: Diagnosis not present

## 2020-07-12 NOTE — Patient Instructions (Signed)
If you are age 80 or older, your body mass index should be between 23-30. Your Body mass index is 22.14 kg/m. If this is out of the aforementioned range listed, please consider follow up with your Primary Care Provider.  If you are age 22 or younger, your body mass index should be between 19-25. Your Body mass index is 22.14 kg/m. If this is out of the aformentioned range listed, please consider follow up with your Primary Care Provider.   Continue Florastor for 30 more days.  Call if bowel movements do not return to normal.

## 2020-07-12 NOTE — Progress Notes (Signed)
ASSESSMENT AND PLAN    # C-diff colitis diagnosed mid August in ED --Improved with course of Vancomycin and Florastor then a few weeks later developed increased frequency of formed stools with urgency. Not retested but we started her on Dificid and she has had significant improvement.  --Continue Florastor for additional 30 days.  --Complete course of Dificid --Since BMs are normalizing she can start to liberalize diet as tolerated.  --She had questions about PPI and risk of C-diff. Though C-diff has been associated with PPI use, Augmentin likely culprit in her case.  --Advised to wait a couple more weeks before restarting Citrucel.   --We discussed risk for recurrent C-diff especially should she require antibiotics in the future.   # Chronic, intermittent loose stools with cramping ( longstanding) --Uses Bentyl as needed and takes fiber but not lately --Probably unlikely cause but she takes Benicar which has been associated with sprue like enteropathy.     HISTORY OF PRESENT ILLNESS     Primary Gastroenterologist :  [place] Cellar, MD  Chief Complaint : follow up on C-diff  Jillian [name] is a 79 y.o. female with PMH / [place] significant for,  but not necessarily limited to: uterine cancer, diverticulosis, adenomatous colon polyps, C-diff colitis  In ED on 8/17 with diarrhea. CT scan showed severe colitis.Patient had taken Augmentin for UTI in early August.  ED prescribed flagyl and Cipro.. Stool returned positive for C-diff. Dr. Havery Moros subsequently changed her antibiotics to Vancomycin and added Florastor which she started on 8/19. Completed Vancomycin on 8/29. Her symptoms got better on treatment. She has a history of frequent loose stools but her her bowel movements became formed. Three weeks later she began getting urgent but solid BMs up to 10 times a day. No blood in stool and no fevers. She wasn't having diarrhea just small amounts of stools multiple times a day.  She  called into the office and on 9/24 we prescribed Dificid but she wasn't able to get the medication right away. She started it on the 29th. Her symptoms have since significantly  improved. Having only 2-4 formed BMs a day now. Finishes Publishing copy and Dificid on Saturday.   Patient asks about diet.  She was given bland diet when first diagnosed with C-diff. She is still restricting her diet and wants to know what she can eat.   Last colonoscopy:  Colonoscopy 12/07/19 -  The examined portion of the ileum was normal. - One 5 mm polyp in the ascending colon, removed with a cold snare. Resected and retrieved. - One 4 mm polyp at the splenic flexure, removed with a cold snare. Resected and retrieved. - One 4 mm polyp in the sigmoid colon, removed with a cold snare. Resected and retrieved. - Diverticulosis in the sigmoid colon and in the ascending colon. - Anal papilla(e) were hypertrophied. - The examination was otherwise normal. - Biopsies were taken with a cold forceps from the right colon, left colon and transverse colon for evaluation of microscopic colitis.  Diagnosis 1. Surgical [P], random colon - COLONIC MUCOSA WITH NO SIGNIFICANT HISTOPATHOLOGIC CHANGES. - NO MICROSCOPIC COLITIS, ACTIVE INFLAMMATION OR GRANULOMAS. 2. Surgical [P], colon, ascending, splenic flexure, sigmoid, polyp (3) - TUBULAR ADENOMA(S). - NO HIGH GRADE DYSPLASIA OR CARCINOMA.  Past Medical History:  Diagnosis Date   Allergy    Arthritis    Cataract    Surgery 2014   Endometrial cancer ([place]) 02/2019   GERD (gastroesophageal reflux disease)  Heart murmur    per pt   Hypertension    Osteoporosis    Osteopenia    Current Medications, Allergies, Past Surgical History, Family History and Social History were reviewed in [employer] record.   Current Outpatient Medications  Medication Sig Dispense Refill   calcium carbonate (CALCIUM 600) 1500 (600 Ca) MG TABS tablet Take 1,200  mg by mouth daily.     cholecalciferol (VITAMIN D3) 25 MCG (1000 UNIT) tablet Take 1,000 Units by mouth daily.     dicyclomine (BENTYL) 10 MG capsule Take 1 capsule (10 mg total) by mouth 2 (two) times daily. 60 capsule 6   EPINEPHrine 0.3 mg/0.3 mL IJ SOAJ injection as needed.     fidaxomicin (DIFICID) 200 MG TABS tablet Take 1 tablet (200 mg total) by mouth 2 (two) times daily for 10 days. 20 tablet 0   ipratropium (ATROVENT) 0.03 % nasal spray Place 2 sprays into both nostrils daily as needed.      Lactobacillus (PROBIOTIC ACIDOPHILUS) TABS Take 1 capsule by mouth daily.      olmesartan (BENICAR) 40 MG tablet Take 40 mg by mouth daily.     pantoprazole (PROTONIX) 40 MG tablet Take 40 mg by mouth See admin instructions. Every 3 to 4 days     No current facility-administered medications for this visit.    Review of Systems: No chest pain. No shortness of breath. No urinary complaints.   PHYSICAL EXAM :    Wt Readings from Last 3 Encounters:  07/12/20 129 lb (58.5 kg)  05/24/20 129 lb (58.5 kg)  02/26/20 130 lb (59 kg)    BP 90/60    Pulse 92    Ht 5\' 4"  (1.626 m)    Wt 129 lb (58.5 kg)    BMI 22.14 kg/m  Constitutional:  Pleasant female in no acute distress. Psychiatric: Normal mood and affect. Behavior is normal. EENT: Pupils normal.  Conjunctivae are normal. No scleral icterus. Neck supple.  Cardiovascular: Normal rate, regular rhythm. No edema Pulmonary/chest: Effort normal and breath sounds normal. No wheezing, rales or rhonchi. Abdominal: Soft, nondistended, nontender. Bowel sounds active throughout. There are no masses palpable. No hepatomegaly. Neurological: Alert and oriented to person place and time. Skin: Skin is warm and dry. No rashes noted.  I spent 30 minutes total reviewing records, obtaining history, performing exam, counseling patient and documenting visit / findings.     Tye Savoy, NP  07/12/2020, 11:19 AM

## 2020-07-13 NOTE — Progress Notes (Signed)
Agree with assessment and plan as outlined.  

## 2020-07-23 ENCOUNTER — Ambulatory Visit: Payer: Medicare PPO | Attending: Internal Medicine

## 2020-07-23 DIAGNOSIS — Z23 Encounter for immunization: Secondary | ICD-10-CM

## 2020-07-23 NOTE — Progress Notes (Signed)
° °  Covid-19 Vaccination Clinic  Name:  Jillian [name]    MRN: [id number] DOB: [birth year]  07/23/2020  Jillian [name] was observed post Covid-19 immunization for 15 minutes without incident. She was provided with Vaccine Information Sheet and instruction to access the V-Safe system.   Jillian [name] was instructed to call 911 with any severe reactions post vaccine:  Difficulty breathing   Swelling of face and throat   A fast heartbeat   A bad rash all over body   Dizziness and weakness

## 2020-08-08 ENCOUNTER — Ambulatory Visit: Payer: Medicare PPO | Admitting: Gastroenterology

## 2020-08-08 ENCOUNTER — Ambulatory Visit (INDEPENDENT_AMBULATORY_CARE_PROVIDER_SITE_OTHER): Payer: Medicare PPO | Admitting: Gastroenterology

## 2020-08-08 ENCOUNTER — Encounter: Payer: Self-pay | Admitting: Gastroenterology

## 2020-08-08 VITALS — BP 120/74 | HR 70 | Ht 64.0 in | Wt 131.0 lb

## 2020-08-08 DIAGNOSIS — K648 Other hemorrhoids: Secondary | ICD-10-CM

## 2020-08-08 DIAGNOSIS — Z8619 Personal history of other infectious and parasitic diseases: Secondary | ICD-10-CM | POA: Diagnosis not present

## 2020-08-08 DIAGNOSIS — R194 Change in bowel habit: Secondary | ICD-10-CM

## 2020-08-08 DIAGNOSIS — K219 Gastro-esophageal reflux disease without esophagitis: Secondary | ICD-10-CM

## 2020-08-08 MED ORDER — PANTOPRAZOLE SODIUM 20 MG PO TBEC
20.0000 mg | DELAYED_RELEASE_TABLET | Freq: Every day | ORAL | 1 refills | Status: DC
Start: 2020-08-08 — End: 2021-05-30

## 2020-08-08 NOTE — Progress Notes (Signed)
HPI :  79 year old female here for follow-up visit.  She has a history of altered bowel habits, mother had colon cancer.  She had a history of endometrial cancer status post surgery, chemotherapy, and radiation which she completed in January 2021.  She had a colonoscopy with me in March 2021, she had a few small polyps removed, diverticulosis of the sigmoid and ascending colon.  Otherwise no inflammation in her colon that was obvious, biopsies showed no evidence of microscopic colitis.  Unfortunately this past August she was given Augmentin for UTI, subsequently developed severe diarrhea and ultimately diagnosed with C. difficile.  We gave her a course of vancomycin and Florastor in August.  She completed that at the end of the month and her symptoms definitely got better.  Unfortunately a few days later she had recurrence of symptoms with more than 10 bowel movements a day.  We empirically switched her to Dificid for suspected C. difficile recurrence.  She states when she took this it quickly helped her symptoms.  This reduce the urgency and stool frequency and solidified her stools.  She completed about 3 weeks ago at this point.  Her diarrhea has stopped, her stools are formed.  She does have about 4 formed stools per day, often in the morning and then occasionally can have some postprandial stool.  She continues to take [employer].  She previously was taking Bentyl but had stopped that.  She inquires about long-term regimen.  She has some suspected hemorrhoid she thinks that are bothering her.  She states she has burning and itching and some discomfort in her rectal tissue that can sometimes prolapse since being treated for the C. difficile she asks for assistance in managing that.  No blood in her stool.  Additionally we discussed her reflux, she has been on pantoprazole longstanding.  She has failed Pepcid in the past, it has caused palpitations.  Remotely she was on Dexilant as well.  She had a  remote EGD with Dr. Maurene Capes in 2000 which did not show any evidence of Barrett's.  She has been taking Protonix 40 mg once every 4 to 5 days or so.  She states she tries to take the least amount of medicine she needs to control her symptoms but if she does not take it she will have severe reflux that can bother her especially in the setting of food triggers.  Prior screening exams: Colonoscopy5/2015 - sigmoid diverticulosis, rectal hyperplastic polyps, no inflammatory changes Colonoscopy 12/2008 - no polyps, diverticulosis Colonoscopy 12/2003 - normal, diverticulosis  Colonoscopy 12/07/19 -  The examined portion of the ileum was normal. - One 5 mm polyp in the ascending colon, removed with a cold snare. Resected and retrieved. - One 4 mm polyp at the splenic flexure, removed with a cold snare. Resected and retrieved. - One 4 mm polyp in the sigmoid colon, removed with a cold snare. Resected and retrieved. - Diverticulosis in the sigmoid colon and in the ascending colon. - Anal papilla(e) were hypertrophied. - The examination was otherwise normal. - Biopsies were taken with a cold forceps from the right colon, left colon and transverse colon for evaluation of microscopic colitis.  1. Surgical [P], random colon - COLONIC MUCOSA WITH NO SIGNIFICANT HISTOPATHOLOGIC CHANGES. - NO MICROSCOPIC COLITIS, ACTIVE INFLAMMATION OR GRANULOMAS. 2. Surgical [P], colon, ascending, splenic flexure, sigmoid, polyp (3) - TUBULAR ADENOMA(S). - NO HIGH GRADE DYSPLASIA OR CARCINOMA.   Past Medical History:  Diagnosis Date   Allergy  Arthritis    Cataract    Surgery 2014   Endometrial cancer ([place]) 02/2019   GERD (gastroesophageal reflux disease)    Heart murmur    per pt   Hypertension    Osteoporosis    Osteopenia     Past Surgical History:  Procedure Laterality Date   BIOPSY ENDOMETRIAL  02/2019   endometrium carcinoma   BREAST ENHANCEMENT SURGERY Bilateral 2006   and face lift    CATARACT EXTRACTION Bilateral 10/2012, 01/2013   COLONOSCOPY     02/10/2014   KNEE ARTHROSCOPY WITH EXCISION BAKER'S CYST Left 1990   LAPAROSCOPIC HYSTERECTOMY     TUBAL LIGATION  1977   UPPER GASTROINTESTINAL ENDOSCOPY  1990s   WISDOM TOOTH EXTRACTION  1990   Family History  Problem Relation Age of Onset   Alzheimer's disease Mother    Colon cancer Mother 11   Endometrial cancer Mother    Heart disease Mother    Alzheimer's disease Father    Ovarian cancer Maternal Grandmother    Breast cancer Maternal Grandmother    Colon cancer Maternal Uncle    Esophageal cancer Neg Hx    Stomach cancer Neg Hx    Prostate cancer Neg Hx    Rectal cancer Neg Hx    Social History   Tobacco Use   Smoking status: Former Smoker    Quit date: 10/08/1990    Years since quitting: 29.8   Smokeless tobacco: Never Used  Vaping Use   Vaping Use: Never used  Substance Use Topics   Alcohol use: Yes    Alcohol/week: 7.0 standard drinks    Types: 7 Glasses of wine per week    Comment: glass of wine at night    Drug use: No   Current Outpatient Medications  Medication Sig Dispense Refill   calcium carbonate (CALCIUM 600) 1500 (600 Ca) MG TABS tablet Take 1,200 mg by mouth daily.     cholecalciferol (VITAMIN D3) 25 MCG (1000 UNIT) tablet Take 1,000 Units by mouth daily.     dicyclomine (BENTYL) 10 MG capsule Take 1 capsule (10 mg total) by mouth 2 (two) times daily. 60 capsule 6   EPINEPHrine 0.3 mg/0.3 mL IJ SOAJ injection as needed.     ipratropium (ATROVENT) 0.03 % nasal spray Place 2 sprays into both nostrils daily as needed.      Lactobacillus (PROBIOTIC ACIDOPHILUS) TABS Take 1 capsule by mouth daily.      olmesartan (BENICAR) 40 MG tablet Take 40 mg by mouth daily.     pantoprazole (PROTONIX) 40 MG tablet Take 40 mg by mouth See admin instructions. Every 3 to 4 days     No current facility-administered medications for this visit.   No Known  Allergies   Review of Systems: All systems reviewed and negative except where noted in HPI.   Lab Results  Component Value Date   WBC 7.8 05/24/2020   HGB 11.7 (L) 05/24/2020   HCT 34.6 (L) 05/24/2020   MCV 96.9 05/24/2020   PLT 171 05/24/2020    Lab Results  Component Value Date   CREATININE 1.21 (H) 05/24/2020   BUN 21 05/24/2020   NA 139 05/24/2020   K 3.4 (L) 05/24/2020   CL 108 05/24/2020   CO2 22 05/24/2020    Lab Results  Component Value Date   ALT 23 05/24/2020   AST 17 05/24/2020   ALKPHOS 46 05/24/2020   BILITOT 0.8 05/24/2020     Physical Exam: BP 120/74  Pulse 70    Ht 5\' 4"  (1.626 m)    Wt 131 lb (59.4 kg)    BMI 22.49 kg/m  Constitutional: Pleasant,well-developed, female in no acute distress. HEENT: Normocephalic and atraumatic. Conjunctivae are normal. No scleral icterus. Neck supple.  Cardiovascular: Normal rate, regular rhythm.  Pulmonary/chest: Effort normal and breath sounds normal. No wheezing, rales or rhonchi. Abdominal: Soft, nondistended, nontender.. There are no masses palpable.  DRE / Anoscopy - Dotty Smith CMA as standby - no fissure, no mass lesions, inflamed internal hemorrhoid RP area on anoscopy Extremities: no edema Lymphadenopathy: No cervical adenopathy noted. Neurological: Alert and oriented to person place and time. Skin: Skin is warm and dry. No rashes noted. Psychiatric: Normal mood and affect. Behavior is normal.   ASSESSMENT AND PLAN: 79 year old female here for reassessment of the following:  History of C Diff   Altered bowel habits Internal Hemorrhoids GERD  Patient with baseline intermittent loose stools with a relatively recent colonoscopy, unfortunately developed C. difficile in the setting of antibiotic use this past summer.  She had recurrence of similar symptoms which led to treatment with fidaxomicin following an initial course of vancomycin.  She has done much better after that episode.  Her stools are now  solid after completing the course, her stool frequency is slightly more than baseline but generally doing much better.  We discussed options moving forward.  It may take some time for her bowels to get back to normal following infectious enteritis, but with formed stools it is very unlikely she has active C Diff. Based on recent updated ACG guidelines on management of C. difficile, probiotics are no longer recommended to prevent recurrence due to lack of efficacy on recent data. In this light,  I think okay to stop the Florastor at this time.  Recommend she resume Citrucel once daily to provide some regularity to her bowels.  She can resume Bentyl as needed.  On anoscopy today she has an inflamed internal hemorrhoid in the setting of recent diarrhea.  Reassured her of recent colonoscopy and no other concerning findings otherwise.  I gave her a trial of some Carmol suppositories to provide some relief and she can use some topical 1% hydrocortisone cream as needed as well.  If uses hydrocortisone recommend only for 7 to 10 days at a time and counseled her on risk of long-term therapy.  Otherwise we discussed her proton pump inhibitor to use as below, as it is a risk factor for recurrent C Diff.  She has failed H2 blockers for her reflux and is quite symptomatic if she does not take PPI.  After discussion of options she will try to reduce her Protonix from 40 mg to 20 mg and minimize its use moving forward, use the lowest dose needed to control symptoms.  She is agreeable to this.  If her reflux gets significantly worse on the lower dose of Protonix she can increase back to 40 mg as needed, but understands the PPIs can increase her risk for recurrence.  Fortunately she is not taking this daily, continue periodic use to control symptoms.  She can follow-up with me as needed for these issues.  If she has recurrence of C. difficile symptoms again she will need to contact me and we will retest her.  Olivia Cellar,  MD [facility]

## 2020-08-08 NOTE — Patient Instructions (Signed)
Please discontinue Florastor.  Please purchase the following medications over the counter and take as directed: Citrucel daily Calmol as needed Hydrocortisone 1% cream as needed  Please decrease your pantoprazole dosage to 20 mg once daily as needed (we will send a new prescription for this).  Continue Bentyl as needed.  If you are age 80 or older, your body mass index should be between 23-30. Your Body mass index is 22.49 kg/m. If this is out of the aforementioned range listed, please consider follow up with your Primary Care Provider.  Due to recent changes in healthcare laws, you may see the results of your imaging and laboratory studies on MyChart before your provider has had a chance to review them.  We understand that in some cases there may be results that are confusing or concerning to you. Not all laboratory results come back in the same time frame and the provider may be waiting for multiple results in order to interpret others.  Please give [place] 48 hours in order for your provider to thoroughly review all the results before contacting the office for clarification of your results.

## 2020-09-10 DIAGNOSIS — R1033 Periumbilical pain: Secondary | ICD-10-CM | POA: Diagnosis not present

## 2020-09-10 DIAGNOSIS — N281 Cyst of kidney, acquired: Secondary | ICD-10-CM | POA: Diagnosis not present

## 2020-09-10 DIAGNOSIS — K56609 Unspecified intestinal obstruction, unspecified as to partial versus complete obstruction: Secondary | ICD-10-CM | POA: Diagnosis not present

## 2020-09-10 DIAGNOSIS — K5989 Other specified functional intestinal disorders: Secondary | ICD-10-CM | POA: Diagnosis not present

## 2020-09-10 DIAGNOSIS — R1084 Generalized abdominal pain: Secondary | ICD-10-CM | POA: Diagnosis not present

## 2020-09-10 DIAGNOSIS — Z8542 Personal history of malignant neoplasm of other parts of uterus: Secondary | ICD-10-CM | POA: Diagnosis not present

## 2020-09-10 DIAGNOSIS — R1010 Upper abdominal pain, unspecified: Secondary | ICD-10-CM | POA: Diagnosis not present

## 2020-09-11 DIAGNOSIS — Z8542 Personal history of malignant neoplasm of other parts of uterus: Secondary | ICD-10-CM | POA: Diagnosis not present

## 2020-09-11 DIAGNOSIS — K56609 Unspecified intestinal obstruction, unspecified as to partial versus complete obstruction: Secondary | ICD-10-CM | POA: Diagnosis not present

## 2020-09-11 DIAGNOSIS — K6389 Other specified diseases of intestine: Secondary | ICD-10-CM | POA: Diagnosis not present

## 2020-09-11 DIAGNOSIS — J984 Other disorders of lung: Secondary | ICD-10-CM | POA: Diagnosis not present

## 2020-09-14 DIAGNOSIS — C541 Malignant neoplasm of endometrium: Secondary | ICD-10-CM | POA: Diagnosis not present

## 2020-09-14 DIAGNOSIS — Z08 Encounter for follow-up examination after completed treatment for malignant neoplasm: Secondary | ICD-10-CM | POA: Diagnosis not present

## 2020-09-14 DIAGNOSIS — K660 Peritoneal adhesions (postprocedural) (postinfection): Secondary | ICD-10-CM | POA: Diagnosis not present

## 2020-09-14 DIAGNOSIS — Z8542 Personal history of malignant neoplasm of other parts of uterus: Secondary | ICD-10-CM | POA: Diagnosis not present

## 2020-09-14 DIAGNOSIS — R918 Other nonspecific abnormal finding of lung field: Secondary | ICD-10-CM | POA: Diagnosis not present

## 2020-09-21 DIAGNOSIS — M546 Pain in thoracic spine: Secondary | ICD-10-CM | POA: Diagnosis not present

## 2020-09-21 DIAGNOSIS — M5134 Other intervertebral disc degeneration, thoracic region: Secondary | ICD-10-CM | POA: Diagnosis not present

## 2020-09-26 ENCOUNTER — Telehealth: Payer: Self-pay | Admitting: Gastroenterology

## 2020-09-26 NOTE — Telephone Encounter (Signed)
Pt is requesting a call back from a nurse to discuss a diet she was implemented by the hospital.

## 2020-09-26 NOTE — Telephone Encounter (Signed)
Spoke with patient, she states that a couple of weeks ago she woke up in the middle of the night with severe abdominal pain, pt describes as "felt like someone was sticking a knife into it", pt states that she went to [facility] ED in [place] on Saturday (09/10/20), she was diagnosed with a small bowel obstruction and was admitted, she has been on a low fiber/residue diet. Pt wanted to make Dr. Havery Moros aware, pt also wanting to know if she should come in. Advised that it would be best for her to come in for a follow up. Advised that I will request records and imaging from [facility] for Dr. Doyne Keel review. Pt has been scheduled for a follow up on 09/11/20 at 8:10 AM.   Request has been faxed to [facility] at [phone].

## 2020-09-26 NOTE — Telephone Encounter (Signed)
Noted  

## 2020-09-26 NOTE — Telephone Encounter (Signed)
Sorry to hear about this. Yes would be good to get records for review and I will see her in the clinic for follow up. She should remain on low residue diet and call us in the interim with any recurrent symptoms. Thanks

## 2020-10-03 ENCOUNTER — Encounter: Payer: Self-pay | Admitting: Family Medicine

## 2020-10-03 ENCOUNTER — Ambulatory Visit: Payer: Medicare PPO | Admitting: Family Medicine

## 2020-10-03 ENCOUNTER — Other Ambulatory Visit: Payer: Self-pay

## 2020-10-03 VITALS — BP 108/78 | HR 78 | Ht 64.0 in | Wt 128.8 lb

## 2020-10-03 DIAGNOSIS — G8929 Other chronic pain: Secondary | ICD-10-CM

## 2020-10-03 DIAGNOSIS — E440 Moderate protein-calorie malnutrition: Secondary | ICD-10-CM | POA: Insufficient documentation

## 2020-10-03 DIAGNOSIS — M546 Pain in thoracic spine: Secondary | ICD-10-CM

## 2020-10-03 NOTE — Progress Notes (Signed)
   I, Philbert Riser, LAT, ATC acting as a scribe for Clementeen Graham, MD.  Subjective:    CC: Thoracic back pain  HPI: Pt is a 79yo female c/o thoracic back pain that's been ongoing since mid-Jan. PCP dx mild arthritis, but pt reports it's been getting progressively worse. No known MOI. Pt locates pain to mid-spine along bra line.  Alleviates: Laying in bed, resting.  Rx tried: arthritis tylenol, heating pad   Pertinent review of Systems: No fevers or chills  Relevant historical information: History of endometrial cancer requiring radiation and chemotherapy 2020.  C. difficile colitis August 2021.   Objective:    Vitals:   10/03/20 1330  BP: 108/78  Pulse: 78  SpO2: 100%   General: Well Developed, a bit of muscle atrophy present in hands and face bilaterally indicates probable moderate protein calorie malnutrition, and in no acute distress.   MSK: T-spine slight thoracic kyphosis otherwise normal-appearing Nontender midline.  Tender palpation thoracic paraspinal musculature. Scapular motion normal but somewhat painful.  Lab and Radiology Results  EXAM: THORACIC SPINE 2 VIEWS  COMPARISON: 11/05/2019  FINDINGS: Dual lumen RIGHT jugular Port-A-Cath tip projecting over SVC near cavoatrial junction.  Osseous demineralization.  12 pairs of ribs.  Scattered mild disc space narrowing and endplate spur formation.  Vertebral body heights maintained.  No fracture, subluxation or bone destruction.  IMPRESSION: Osseous demineralization and mild degenerative disc disease changes thoracic spine.  No acute abnormalities.   Electronically Signed By: Ulyses Southward M.D. On: 09/22/2020 14:16  I, Clementeen Graham, personally (independently) visualized and performed the interpretation of the images attached in this note.    Impression and Recommendations:    Assessment and Plan: 79 y.o. female with thoracic back pain concerning for periscapular and paraspinal muscle  dysfunction.  X-ray obtained recently does show some degenerative changes but no acute changes.  Plan for physical therapy.  Also recommend increasing protein.  Recheck in about 6 weeks.  Return sooner if needed.  PDMP not reviewed this encounter. Orders Placed This Encounter  Procedures  . Ambulatory referral to Physical Therapy    Referral Priority:   Routine    Referral Type:   Physical Medicine    Referral Reason:   Specialty Services Required    Requested Specialty:   Physical Therapy   No orders of the defined types were placed in this encounter.   Discussed warning signs or symptoms. Please see discharge instructions. Patient expresses understanding.   The above documentation has been reviewed and is accurate and complete Clementeen Graham, M.D.

## 2020-10-03 NOTE — Patient Instructions (Addendum)
Thank you for coming in today.  Plan for physical therapy.   Also try to increase your protein by 30-60 grams per day.   Try protein supplement like [employer] Protein shakes or muscle milk or ensure with protein.   Recheck in about 6 weeks.   Let me know sooner if this is not working.

## 2020-10-12 ENCOUNTER — Ambulatory Visit (INDEPENDENT_AMBULATORY_CARE_PROVIDER_SITE_OTHER): Payer: Medicare PPO | Admitting: Gastroenterology

## 2020-10-12 ENCOUNTER — Encounter: Payer: Self-pay | Admitting: Gastroenterology

## 2020-10-12 VITALS — BP 110/70 | HR 58 | Ht 64.0 in | Wt 130.2 lb

## 2020-10-12 DIAGNOSIS — Z90710 Acquired absence of both cervix and uterus: Secondary | ICD-10-CM

## 2020-10-12 DIAGNOSIS — Z923 Personal history of irradiation: Secondary | ICD-10-CM | POA: Diagnosis not present

## 2020-10-12 DIAGNOSIS — K5900 Constipation, unspecified: Secondary | ICD-10-CM | POA: Diagnosis not present

## 2020-10-12 DIAGNOSIS — K56609 Unspecified intestinal obstruction, unspecified as to partial versus complete obstruction: Secondary | ICD-10-CM | POA: Diagnosis not present

## 2020-10-12 MED ORDER — DICYCLOMINE HCL 10 MG PO CAPS
10.0000 mg | ORAL_CAPSULE | Freq: Three times a day (TID) | ORAL | 1 refills | Status: DC | PRN
Start: 2020-10-12 — End: 2021-12-11

## 2020-10-12 MED ORDER — POLYETHYLENE GLYCOL 3350 17 G PO PACK
17.0000 g | PACK | Freq: Every day | ORAL | 0 refills | Status: DC
Start: 2020-10-12 — End: 2022-06-06

## 2020-10-12 NOTE — Patient Instructions (Addendum)
If you are age 81 or older, your body mass index should be between 23-30. Your Body mass index is 22.35 kg/m. If this is out of the aforementioned range listed, please consider follow up with your Primary Care Provider.  If you are age 57 or younger, your body mass index should be between 19-25. Your Body mass index is 22.35 kg/m. If this is out of the aformentioned range listed, please consider follow up with your Primary Care Provider.   Take Miralax, as directed Daily.   We have sent the following medications to your pharmacy for you to pick up at your convenience: Bentyl 10 mg: Take 1 tablet every 8 hours as needed   Thank you for entrusting me with your care and for choosing [facility], Dr. Ileene Patrick

## 2020-10-12 NOTE — Progress Notes (Signed)
HPI :  80 year old female here for follow-up visit for history of recent bowel obstruction.  See prior notes for full details of her case.  She has a history of altered bowel habits, mother had colon cancer. She had a history of endometrial cancer status post surgery, chemotherapy, and radiation which she completed in January 2021.  She had a colonoscopy with me in March 2021, she had a few small polyps removed, diverticulosis of the sigmoid and ascending colon.  Otherwise no inflammation in her colon that was obvious, biopsies showed no evidence of microscopic colitis.  Terminal ileum was normal.  The summer she had received Augmentin for UTI and subsequently developed C. difficile.  She eventually got over that and recovered.  Since I last saw her she was admitted to [facility] in [place] with a small bowel obstruction.  On December 4 she states she awoke at 2 AM with sudden onset severe umbilical abdominal pain with nausea.  She went to the hospital had a work-up with labs and CT scan done as outlined below.  Labs: WBC 7.6, Hgb 13.5, MCV 95, plt 231 BUN 28, Cr 1.12, other electrolytes normal T bil 0.8, AST 22, ALT 20, AP 51, lipase 27  CT scan shows small bowel obstruction - distended ileum and distal jejunum, with possible transition point in the pelvis. Admitted and treated with conservative management and improved  [place] - CBD 32mm, no gallstones, GB normal, other other acute findings  She states she was discharged after 2 days in the hospital.  She has been maintained on a low residual diet and generally has done pretty well.  She has developed some constipation and has not been taking much of anything for this.  She has been on a low fiber diet since her discharge and thinks may be related to her bowel changes.  She has had recent fleeting episodes of very mild discomfort in that area but nothing significant.  She states this happened twice in the past week or so but very  short-lived.  She is otherwise feeling well and think she has mostly recovered from hospitalization.  This is the first time she is ever had a bowel obstruction.   Prior screening exams: Colonoscopy5/2015 - sigmoid diverticulosis, rectal hyperplastic polyps, no inflammatory changes Colonoscopy 12/2008 - no polyps, diverticulosis Colonoscopy 12/2003 - normal, diverticulosis  Colonoscopy 12/07/19 - The examined portion of the ileum was normal. - One 5 mm polyp in the ascending colon, removed with a cold snare. Resected and retrieved. - One 4 mm polyp at the splenic flexure, removed with a cold snare. Resected and retrieved. - One 4 mm polyp in the sigmoid colon, removed with a cold snare. Resected and retrieved. - Diverticulosis in the sigmoid colon and in the ascending colon. - Anal papilla(e) were hypertrophied. - The examination was otherwise normal. - Biopsies were taken with a cold forceps from the right colon, left colon and transverse colon for evaluation of microscopic colitis.  1. Surgical [P], random colon - COLONIC MUCOSA WITH NO SIGNIFICANT HISTOPATHOLOGIC CHANGES. - NO MICROSCOPIC COLITIS, ACTIVE INFLAMMATION OR GRANULOMAS. 2. Surgical [P], colon, ascending, splenic flexure, sigmoid, polyp (3) - TUBULAR ADENOMA(S). - NO HIGH GRADE DYSPLASIA OR CARCINOMA.    Past Medical History:  Diagnosis Date  . Allergy   . Arthritis   . Cataract    Surgery 2014  . Endometrial cancer ([place]) 02/2019  . GERD (gastroesophageal reflux disease)   . Heart murmur    per  pt  . Hypertension   . Osteoporosis    Osteopenia  . Small bowel obstruction [place])      Past Surgical History:  Procedure Laterality Date  . BIOPSY ENDOMETRIAL  02/2019   endometrium carcinoma  . BREAST ENHANCEMENT SURGERY Bilateral 2006   and face lift  . CATARACT EXTRACTION Bilateral 10/2012, 01/2013  . COLONOSCOPY     02/10/2014  . KNEE ARTHROSCOPY WITH EXCISION BAKER'S CYST Left 1990  . LAPAROSCOPIC  HYSTERECTOMY    . TUBAL LIGATION  1977  . UPPER GASTROINTESTINAL ENDOSCOPY  1990s  . WISDOM TOOTH EXTRACTION  1990   Family History  Problem Relation Age of Onset  . Alzheimer's disease Mother   . Colon cancer Mother 47  . Endometrial cancer Mother   . Heart disease Mother   . Alzheimer's disease Father   . Ovarian cancer Maternal Grandmother   . Breast cancer Maternal Grandmother   . Colon cancer Maternal Uncle   . Esophageal cancer Neg Hx   . Stomach cancer Neg Hx   . Prostate cancer Neg Hx   . Rectal cancer Neg Hx    Social History   Tobacco Use  . Smoking status: Former Smoker    Types: Cigarettes    Quit date: 10/08/1990    Years since quitting: 30.0  . Smokeless tobacco: Never Used  Vaping Use  . Vaping Use: Never used  Substance Use Topics  . Alcohol use: Not Currently  . Drug use: No   Current Outpatient Medications  Medication Sig Dispense Refill  . CALCIUM CITRATE PO Take 1 tablet by mouth daily.    Marland Kitchen dicyclomine (BENTYL) 10 MG capsule Take 1 capsule (10 mg total) by mouth every 8 (eight) hours as needed for spasms. 30 capsule 1  . EPINEPHrine 0.3 mg/0.3 mL IJ SOAJ injection Inject 0.3 mg into the muscle as needed for anaphylaxis.    Marland Kitchen ipratropium (ATROVENT) 0.03 % nasal spray Place 2 sprays into both nostrils daily as needed.     . Multiple Vitamins-Minerals (CENTRUM SILVER PO) Take 1 tablet by mouth daily.    Marland Kitchen olmesartan (BENICAR) 40 MG tablet Take 40 mg by mouth See admin instructions. Take every 3 days    . pantoprazole (PROTONIX) 20 MG tablet Take 1 tablet (20 mg total) by mouth daily. 30 tablet 1  . polyethylene glycol (MIRALAX) 17 g packet Take 17 g by mouth daily. 14 each 0  . Pyridoxine HCl (VITAMIN B6) 50 MG TABS Take 1 tablet by mouth daily.     No current facility-administered medications for this visit.   No Known Allergies   Review of Systems: All systems reviewed and negative except where noted in HPI.   Lab Results  Component Value Date    WBC 7.8 05/24/2020   HGB 11.7 (L) 05/24/2020   HCT 34.6 (L) 05/24/2020   MCV 96.9 05/24/2020   PLT 171 05/24/2020   Lab Results  Component Value Date   CREATININE 1.21 (H) 05/24/2020   BUN 21 05/24/2020   NA 139 05/24/2020   K 3.4 (L) 05/24/2020   CL 108 05/24/2020   CO2 22 05/24/2020    Lab Results  Component Value Date   ALT 23 05/24/2020   AST 17 05/24/2020   ALKPHOS 46 05/24/2020   BILITOT 0.8 05/24/2020      Physical Exam: BP 110/70   Pulse (!) 58   Ht 5\' 4"  (1.626 m)   Wt 130 lb 3.2 oz (59.1 kg)  SpO2 99%   BMI 22.35 kg/m  Constitutional: Pleasant,well-developed, female in no acute distress. Abdominal: Soft, nondistended, nontender.There are no masses palpable.  Extremities: no edema Lymphadenopathy: No cervical adenopathy noted. Neurological: Alert and oriented to person place and time. Skin: Skin is warm and dry. No rashes noted. Psychiatric: Normal mood and affect. Behavior is normal.   ASSESSMENT AND PLAN: 80 year old female here for reassessment of the following:  Small bowel obstruction / History of radiation therapy / history of hysterectomy - as above acute onset pain associate with nausea which led to hospitalization in [facility], CT scan showed small bowel obstruction in the pelvis, likely distal small bowel.  She recently had a colonoscopy with ileal intubation that looked okay, no evidence of Crohn's.  Discussed differential diagnosis with her for that obstruction to include adhesive disease versus radiation changes /stricturing.  She generally has done well since the discharge and not much symptoms that have bothered her with exception for 2 fleeting episodes of mild symptoms within the past week.  We discussed plans moving forward.  We could consider an enterography study to reassess her small bowel and assess for persistent inflammatory changes from radiation, or assess for focal point of stricturing; however, question will be what we do with  this information, as we do not have any therapy for radiation enteritis and she wants to avoid any elective surgeries (if adhesive band noted, etc).  Given she is feeling well she wants to hold off on imaging for now.  She will continue low residue diet.  If she has recurrence of symptoms or persistent abdominal pain she will let me know we will reconsider imaging in that setting.  She can use Bentyl as needed for any occasional cramps or pain that she may have.  She agreed with the plan will keep me posted with any recurrence of symptoms.  Constipation - worsening lately, likely due to low fiber diet.  Recommend using MiraLAX once daily to keep her bowels moving regularly. She can titrate as needed. She agreed  [place] Cellar, MD [facility] Gastroenterology

## 2020-10-13 ENCOUNTER — Ambulatory Visit (INDEPENDENT_AMBULATORY_CARE_PROVIDER_SITE_OTHER): Payer: Medicare PPO | Admitting: Physical Therapy

## 2020-10-13 ENCOUNTER — Other Ambulatory Visit: Payer: Self-pay

## 2020-10-13 ENCOUNTER — Encounter: Payer: Self-pay | Admitting: Physical Therapy

## 2020-10-13 DIAGNOSIS — M546 Pain in thoracic spine: Secondary | ICD-10-CM | POA: Diagnosis not present

## 2020-10-15 ENCOUNTER — Encounter: Payer: Self-pay | Admitting: Physical Therapy

## 2020-10-15 NOTE — Therapy (Signed)
[facility] [address] [place], [place], [address] Phone: [phone]   Fax:  [phone]  Physical Therapy Evaluation  Patient Details  Name: Jillian [name] MRN: [id number] Date of Birth: 09/18/1941 Referring Provider (PT): Lynne Leader   Encounter Date: 10/13/2020   PT End of Session - 10/15/20 1444    Visit Number 1    Number of Visits 12    Date for PT Re-Evaluation 11/24/20    Authorization Type [employer]    PT Start Time 1255    PT Stop Time 1330    PT Time Calculation (min) 35 min    Activity Tolerance Patient tolerated treatment well    Behavior During Therapy [facility] for tasks assessed/performed           Past Medical History:  Diagnosis Date  . Allergy   . Arthritis   . Cataract    Surgery 2014  . Endometrial cancer ([place]) 02/2019  . GERD (gastroesophageal reflux disease)   . Heart murmur    per pt  . Hypertension   . Osteoporosis    Osteopenia  . Small bowel obstruction [facility])     Past Surgical History:  Procedure Laterality Date  . BIOPSY ENDOMETRIAL  02/2019   endometrium carcinoma  . BREAST ENHANCEMENT SURGERY Bilateral 2006   and face lift  . CATARACT EXTRACTION Bilateral 10/2012, 01/2013  . COLONOSCOPY     02/10/2014  . KNEE ARTHROSCOPY WITH EXCISION BAKER'S CYST Left 1990  . LAPAROSCOPIC HYSTERECTOMY    . TUBAL LIGATION  1977  . UPPER GASTROINTESTINAL ENDOSCOPY  1990s  . WISDOM TOOTH EXTRACTION  1990    There were no vitals filed for this visit.    Subjective Assessment - 10/15/20 1443    Subjective Pt states increased thoracic pain L and R. Variable sides. Pain for about 1 yr, no incident to report. Did have CA treatment in 2020.    Pertinent History CA, recent small bowel obstruction.    Limitations Standing    Diagnostic tests Recent thoracic x-ray    Patient Stated Goals Decreased pain    Currently in Pain? Yes    Pain Score 6     Pain Location Back    Pain Orientation Right;Left;Mid    Pain  Descriptors / Indicators Aching    Pain Type Chronic pain    Pain Onset More than a month ago    Pain Frequency Intermittent    Aggravating Factors  standing, walking longer distances, lifting arms up overhead              [facility] PT Assessment - 10/15/20 0001      Assessment   Medical Diagnosis Thoracic pain    Referring Provider (PT) Lynne Leader    Hand Dominance Right    Prior Therapy no      Balance Screen   Has the patient fallen in the past 6 months No      Prior Function   Level of Independence Independent      Cognition   Overall Cognitive Status Within Functional Limits for tasks assessed      Posture/Postural Control   Posture Comments Thoracic kyphosis      ROM / Strength   AROM / PROM / Strength AROM;Strength      AROM   Overall AROM Comments Lumbar: flexion: wfl, ext; mild ext, SB: WFL, Rot: WFL,   Shoulders: mild limitation for flexion/increased pain      Strength   Overall Strength  Comments Shoulders: 4/5 gross      Palpation   Palpation comment Pain L>R mid scapular region, rhomboids, into mid/upper trap and levator on L. Trigger points in levator and rhomboid today                      Objective measurements completed on examination: See above findings.       [facility] Adult PT Treatment/Exercise - 10/15/20 0001      Exercises   Exercises Lumbar      Manual Therapy   Manual Therapy Soft tissue mobilization;Passive ROM;Joint mobilization    Joint Mobilization light t-spine PA mobs gr 2/3    Soft tissue mobilization STM/DTm to BIl rhomoboid, L levator and UT.    Passive ROM Bil shoulder PROM for flexion                  PT Education - 10/15/20 1444    Education Details PT POC, Exam findings, HEP    Person(s) Educated Patient    Methods Explanation;Demonstration;Tactile cues;Verbal cues;Handout    Comprehension Verbalized understanding;Returned demonstration;Verbal cues required;Tactile cues required;Need further  instruction            PT Short Term Goals - 10/15/20 1456      PT SHORT TERM GOAL #1   Title Pt to be independent with initial HEP    Time 2    Period Weeks    Status New    Target Date 10/27/20             PT Long Term Goals - 10/15/20 1457      PT LONG TERM GOAL #1   Title Pt to be independent with final HEP    Time 6    Period Weeks    Status New    Target Date 11/24/20      PT LONG TERM GOAL #2   Title Pt to report decreased pain in thoracic region to 0-2/10 with activity    Time 6    Period Weeks    Status New    Target Date 11/24/20      PT LONG TERM GOAL #3   Title Pt to demo ability for bil UE flexion/elevation without pain in thoracic spine, for improved ability for IADLS, reaching, lifting,    Time 6    Period Weeks    Status New    Target Date 11/24/20      PT LONG TERM GOAL #4   Title Pt to demo soft tissue restrictions to be WNL for thoracic and scapular region.    Time 6    Period Weeks    Status New    Target Date 11/24/20                  Plan - 10/15/20 1450    Clinical Impression Statement Pt presents with primary complaint of increased pain in thoracic spine. Pt with most pain in mid/upper thoracic musculature. Pt with decreased posture with increased thoracic kyphosis, likley effecting pain some as well. Pt with decreaed ability for full functional activities, reaching, and UE activitiy, and exercise, due to pain. Pt has been unable to return to walking program due to pain. Pt to benefit from skilled PT to improve deficits and pain    Examination-Activity Limitations Lift;Stand;Locomotion Level;Reach Overhead;Squat    Examination-Participation Restrictions Cleaning;Meal Prep;Yard Work;Community Activity;Laundry;Shop    Stability/Clinical Decision Making Stable/Uncomplicated    Rehab Potential Good    PT Frequency 2x /  week    PT Duration 6 weeks    PT Treatment/Interventions ADLs/Self Care Home Management;Cryotherapy;Electrical  Stimulation;Ultrasound;Traction;Moist Heat;Iontophoresis 4mg /ml Dexamethasone;DME Instruction;Gait training;Stair training;Functional mobility training;Therapeutic activities;Therapeutic exercise;Patient/family education;Balance training;Neuromuscular re-education;Manual techniques;Vasopneumatic Device;Dry needling;Passive range of motion;Spinal Manipulations;Joint Manipulations;Splinting;Taping    Consulted and Agree with Plan of Care Patient           Patient will benefit from skilled therapeutic intervention in order to improve the following deficits and impairments:  Pain,Improper body mechanics,Postural dysfunction,Increased muscle spasms,Decreased mobility,Decreased activity tolerance,Decreased range of motion,Decreased strength,Hypomobility,Impaired flexibility  Visit Diagnosis: Pain in thoracic spine     Problem List Patient Active Problem List   Diagnosis Date Noted  . Moderate protein-calorie malnutrition ([place]) 10/03/2020  . Endometrial cancer ([place]) 01/25/2020  . Postmenopausal bleeding 03/04/2019  . ABDOMINAL PAIN, UNSPECIFIED SITE 08/20/2008  . DUODENITIS 08/17/2008  . DIVERTICULOSIS, COLON 08/17/2008  . ABDOMINAL BLOATING 08/17/2008  . DIARRHEA 08/17/2008  . GASTRITIS, HX OF 08/17/2008    Lyndee Hensen, PT, DPT 3:00 PM  10/15/20    [place], [place], [address] Phone: [phone]   Fax:  [phone]  Name: SHIASIA PORRO MRN: [id number] Date of Birth: 14-Jul-1941

## 2020-10-19 ENCOUNTER — Other Ambulatory Visit: Payer: Self-pay

## 2020-10-19 ENCOUNTER — Encounter: Payer: Self-pay | Admitting: Physical Therapy

## 2020-10-19 ENCOUNTER — Ambulatory Visit (INDEPENDENT_AMBULATORY_CARE_PROVIDER_SITE_OTHER): Payer: Medicare PPO | Admitting: Physical Therapy

## 2020-10-19 DIAGNOSIS — M546 Pain in thoracic spine: Secondary | ICD-10-CM | POA: Diagnosis not present

## 2020-10-19 NOTE — Patient Instructions (Signed)
Access Code: [email or web address] URL: [email or web address] Date: 10/19/2020 Prepared by: Lyndee Hensen  Exercises Standing Backward Shoulder Rolls - 2 x daily - 1 sets - 10 reps Seated Scapular Retraction - 2 x daily - 1 sets - 10 reps Supine Shoulder Flexion Extension AAROM with Dowel - 1 x daily - 1 sets - 10-15 reps Supine Single Knee to Chest Stretch - 2 x daily - 3 reps - 30 hold Supine Posterior Pelvic Tilt - 2 x daily - 1 sets - 10 reps

## 2020-10-19 NOTE — Therapy (Signed)
[facility] [address] [place], [place], [address] Phone: [phone]   Fax:  [phone]  Physical Therapy Treatment  Patient Details  Name: Jillian [name] MRN: [id number] Date of Birth: 1941/07/30 Referring Provider (PT): Lynne Leader   Encounter Date: 10/19/2020   PT End of Session - 10/19/20 1034    Visit Number 2    Number of Visits 12    Date for PT Re-Evaluation 11/24/20    Authorization Type [employer]    PT Start Time [phone]    PT Stop Time 0930    PT Time Calculation (min) 44 min    Activity Tolerance Patient tolerated treatment well    Behavior During Therapy [facility] for tasks assessed/performed           Past Medical History:  Diagnosis Date  . Allergy   . Arthritis   . Cataract    Surgery 2014  . Endometrial cancer ([place]) 02/2019  . GERD (gastroesophageal reflux disease)   . Heart murmur    per pt  . Hypertension   . Osteoporosis    Osteopenia  . Small bowel obstruction [facility])     Past Surgical History:  Procedure Laterality Date  . BIOPSY ENDOMETRIAL  02/2019   endometrium carcinoma  . BREAST ENHANCEMENT SURGERY Bilateral 2006   and face lift  . CATARACT EXTRACTION Bilateral 10/2012, 01/2013  . COLONOSCOPY     02/10/2014  . KNEE ARTHROSCOPY WITH EXCISION BAKER'S CYST Left 1990  . LAPAROSCOPIC HYSTERECTOMY    . TUBAL LIGATION  1977  . UPPER GASTROINTESTINAL ENDOSCOPY  1990s  . WISDOM TOOTH EXTRACTION  1990    There were no vitals filed for this visit.   Subjective Assessment - 10/19/20 1034    Subjective Pt states more pain on R vs L today. More sore in afternoons, and with doing hair, using blowdryer.    Pertinent History CA, recent small bowel obstruction.    Limitations Standing    Diagnostic tests Recent thoracic x-ray    Patient Stated Goals Decreased pain    Currently in Pain? Yes    Pain Score 4     Pain Location Back    Pain Orientation Right;Left;Mid    Pain Descriptors / Indicators Aching     Pain Type Chronic pain    Pain Onset More than a month ago    Pain Frequency Intermittent                             [facility] Adult PT Treatment/Exercise - 10/19/20 0001      Lumbar Exercises: Stretches   Single Knee to Chest Stretch 3 reps;30 seconds    Pelvic Tilt 15 reps    Other Lumbar Stretch Exercise Shoulder flexion, supine cane AAROM x 15;  Shoulder pulley x 15 flexion    Other Lumbar Stretch Exercise Seated: thoracic rotation x 10;      Lumbar Exercises: Standing   Row 15 reps    Theraband Level (Row) Level 2 (Red)    Other Standing Lumbar Exercises shoulder rolls,      Manual Therapy   Soft tissue mobilization STM/DTm to BIl rhomoboid, bil levator and UT.    Passive ROM Bil shoulder PROM for flexion                    PT Short Term Goals - 10/15/20 1456      PT SHORT TERM GOAL #1  Title Pt to be independent with initial HEP    Time 2    Period Weeks    Status New    Target Date 10/27/20             PT Long Term Goals - 10/15/20 1457      PT LONG TERM GOAL #1   Title Pt to be independent with final HEP    Time 6    Period Weeks    Status New    Target Date 11/24/20      PT LONG TERM GOAL #2   Title Pt to report decreased pain in thoracic region to 0-2/10 with activity    Time 6    Period Weeks    Status New    Target Date 11/24/20      PT LONG TERM GOAL #3   Title Pt to demo ability for bil UE flexion/elevation without pain in thoracic spine, for improved ability for IADLS, reaching, lifting,    Time 6    Period Weeks    Status New    Target Date 11/24/20      PT LONG TERM GOAL #4   Title Pt to demo soft tissue restrictions to be WNL for thoracic and scapular region.    Time 6    Period Weeks    Status New    Target Date 11/24/20                 Plan - 10/19/20 1036    Clinical Impression Statement Pt with minimal back pain with shoulder ROM today. Manual done for tightness and soreness in thoracic  musculature. Pt to benefit from dry needling for this as well, discussed implications for this today. INitial HEP reviewed and issued today. Pt seems to be getting increased pain with increased thoracic extension ROM .    Examination-Activity Limitations Lift;Stand;Locomotion Level;Reach Overhead;Squat    Examination-Participation Restrictions Cleaning;Meal Prep;Yard Work;Community Activity;Laundry;Shop    Stability/Clinical Decision Making Stable/Uncomplicated    Rehab Potential Good    PT Frequency 2x / week    PT Duration 6 weeks    PT Treatment/Interventions ADLs/Self Care Home Management;Cryotherapy;Electrical Stimulation;Ultrasound;Traction;Moist Heat;Iontophoresis 4mg /ml Dexamethasone;DME Instruction;Gait training;Stair training;Functional mobility training;Therapeutic activities;Therapeutic exercise;Patient/family education;Balance training;Neuromuscular re-education;Manual techniques;Vasopneumatic Device;Dry needling;Passive range of motion;Spinal Manipulations;Joint Manipulations;Splinting;Taping    Consulted and Agree with Plan of Care Patient           Patient will benefit from skilled therapeutic intervention in order to improve the following deficits and impairments:  Pain,Improper body mechanics,Postural dysfunction,Increased muscle spasms,Decreased mobility,Decreased activity tolerance,Decreased range of motion,Decreased strength,Hypomobility,Impaired flexibility  Visit Diagnosis: Pain in thoracic spine     Problem List Patient Active Problem List   Diagnosis Date Noted  . Moderate protein-calorie malnutrition ([place]) 10/03/2020  . Endometrial cancer ([place]) 01/25/2020  . Postmenopausal bleeding 03/04/2019  . ABDOMINAL PAIN, UNSPECIFIED SITE 08/20/2008  . DUODENITIS 08/17/2008  . DIVERTICULOSIS, COLON 08/17/2008  . ABDOMINAL BLOATING 08/17/2008  . DIARRHEA 08/17/2008  . GASTRITIS, HX OF 08/17/2008    Lyndee Hensen, PT, DPT 10:42 AM  10/19/20    [facility] [place], [place], [address] Phone: [phone]   Fax:  [phone]  Name: Jillian [name] MRN: [id number] Date of Birth: [birth year]

## 2020-10-24 ENCOUNTER — Encounter: Payer: Medicare PPO | Admitting: Physical Therapy

## 2020-10-26 ENCOUNTER — Ambulatory Visit (INDEPENDENT_AMBULATORY_CARE_PROVIDER_SITE_OTHER): Payer: Medicare PPO | Admitting: Physical Therapy

## 2020-10-26 ENCOUNTER — Encounter: Payer: Self-pay | Admitting: Physical Therapy

## 2020-10-26 DIAGNOSIS — M546 Pain in thoracic spine: Secondary | ICD-10-CM

## 2020-10-26 NOTE — Therapy (Signed)
[facility] [address] [place], [place], [address] Phone: [phone]   Fax:  [phone]  Physical Therapy Treatment  Patient Details  Name: Jillian [name] MRN: [id number] Date of Birth: 1941/06/15 Referring Provider (PT): Lynne Leader   Encounter Date: 10/26/2020   PT End of Session - 10/26/20 1258    Visit Number 3    Number of Visits 12    Date for PT Re-Evaluation 11/24/20    Authorization Type [employer]    PT Start Time 1255    PT Stop Time 1340    PT Time Calculation (min) 45 min    Activity Tolerance Patient tolerated treatment well    Behavior During Therapy [facility] for tasks assessed/performed           Past Medical History:  Diagnosis Date  . Allergy   . Arthritis   . Cataract    Surgery 2014  . Endometrial cancer ([place]) 02/2019  . GERD (gastroesophageal reflux disease)   . Heart murmur    per pt  . Hypertension   . Osteoporosis    Osteopenia  . Small bowel obstruction [facility])     Past Surgical History:  Procedure Laterality Date  . BIOPSY ENDOMETRIAL  02/2019   endometrium carcinoma  . BREAST ENHANCEMENT SURGERY Bilateral 2006   and face lift  . CATARACT EXTRACTION Bilateral 10/2012, 01/2013  . COLONOSCOPY     02/10/2014  . KNEE ARTHROSCOPY WITH EXCISION BAKER'S CYST Left 1990  . LAPAROSCOPIC HYSTERECTOMY    . TUBAL LIGATION  1977  . UPPER GASTROINTESTINAL ENDOSCOPY  1990s  . WISDOM TOOTH EXTRACTION  1990    There were no vitals filed for this visit.   Subjective Assessment - 10/26/20 1257    Subjective Pt states continued pain. Has been doing HEP.    Currently in Pain? Yes    Pain Score 4     Pain Location Back    Pain Orientation Right;Left;Mid    Pain Descriptors / Indicators Aching    Pain Type Chronic pain    Pain Onset More than a month ago    Pain Frequency Intermittent                             [facility] Adult PT Treatment/Exercise - 10/26/20 0001      Lumbar Exercises:  Stretches   Single Knee to Chest Stretch 3 reps;30 seconds    Pelvic Tilt 20 reps    Other Lumbar Stretch Exercise Shoulder flexion, supine cane AAROM x 15;  Shoulder pulley x 15 flexion    Other Lumbar Stretch Exercise --      Lumbar Exercises: Standing   Functional Squats 15 reps    Functional Squats Limitations at chair, for optimal posture with IADLS.    Row 20 reps    Theraband Level (Row) Level 2 (Red)    Other Standing Lumbar Exercises shoulder flexion AROm x 15;      Lumbar Exercises: Seated   Other Seated Lumbar Exercises thoracic rotation x10      Lumbar Exercises: Supine   Ab Set 10 reps    Bent Knee Raise 20 reps    Straight Leg Raise 10 reps    Straight Leg Raises Limitations with TA    Other Supine Lumbar Exercises --      Manual Therapy   Joint Mobilization s/l scapular mobs, sub scap release supine, bil; Thoracic PA mobs.  Soft tissue mobilization STM/DTm to BIl rhomoboid, bil levator and UT.    Passive ROM Bil shoulder PROM for flexion                    PT Short Term Goals - 10/15/20 1456      PT SHORT TERM GOAL #1   Title Pt to be independent with initial HEP    Time 2    Period Weeks    Status New    Target Date 10/27/20             PT Long Term Goals - 10/15/20 1457      PT LONG TERM GOAL #1   Title Pt to be independent with final HEP    Time 6    Period Weeks    Status New    Target Date 11/24/20      PT LONG TERM GOAL #2   Title Pt to report decreased pain in thoracic region to 0-2/10 with activity    Time 6    Period Weeks    Status New    Target Date 11/24/20      PT LONG TERM GOAL #3   Title Pt to demo ability for bil UE flexion/elevation without pain in thoracic spine, for improved ability for IADLS, reaching, lifting,    Time 6    Period Weeks    Status New    Target Date 11/24/20      PT LONG TERM GOAL #4   Title Pt to demo soft tissue restrictions to be WNL for thoracic and scapular region.    Time 6     Period Weeks    Status New    Target Date 11/24/20                 Plan - 10/26/20 1407    Clinical Impression Statement Pt with no tenderness to palpate in thoracic spine or musculature today. Does continue to have increased pain with overhead/elevation of shoulders. Improved in supine with back against table. Focus on shoulder ROM, scapular mobility, and spine mobility today for improving this.Also discussed optimal mehcanics for bending, as pt is getting pain with flexion and reaching.  Plan to progress as tolerated.    Examination-Activity Limitations Lift;Stand;Locomotion Level;Reach Overhead;Squat    Examination-Participation Restrictions Cleaning;Meal Prep;Yard Work;Community Activity;Laundry;Shop    Stability/Clinical Decision Making Stable/Uncomplicated    Rehab Potential Good    PT Frequency 2x / week    PT Duration 6 weeks    PT Treatment/Interventions ADLs/Self Care Home Management;Cryotherapy;Electrical Stimulation;Ultrasound;Traction;Moist Heat;Iontophoresis 4mg /ml Dexamethasone;DME Instruction;Gait training;Stair training;Functional mobility training;Therapeutic activities;Therapeutic exercise;Patient/family education;Balance training;Neuromuscular re-education;Manual techniques;Vasopneumatic Device;Dry needling;Passive range of motion;Spinal Manipulations;Joint Manipulations;Splinting;Taping    Consulted and Agree with Plan of Care Patient           Patient will benefit from skilled therapeutic intervention in order to improve the following deficits and impairments:  Pain,Improper body mechanics,Postural dysfunction,Increased muscle spasms,Decreased mobility,Decreased activity tolerance,Decreased range of motion,Decreased strength,Hypomobility,Impaired flexibility  Visit Diagnosis: Pain in thoracic spine     Problem List Patient Active Problem List   Diagnosis Date Noted  . Moderate protein-calorie malnutrition ([place]) 10/03/2020  . Endometrial cancer ([place])  01/25/2020  . Postmenopausal bleeding 03/04/2019  . ABDOMINAL PAIN, UNSPECIFIED SITE 08/20/2008  . DUODENITIS 08/17/2008  . DIVERTICULOSIS, COLON 08/17/2008  . ABDOMINAL BLOATING 08/17/2008  . DIARRHEA 08/17/2008  . GASTRITIS, HX OF 08/17/2008    Lyndee Hensen, PT, DPT 2:09 PM  10/26/20    [facility] [address] [place], [place], [address] Phone: [phone]   Fax:  [phone]  Name: Jillian [name] MRN: [id number] Date of Birth: July 07, 1941

## 2020-10-31 ENCOUNTER — Ambulatory Visit (INDEPENDENT_AMBULATORY_CARE_PROVIDER_SITE_OTHER): Payer: Medicare PPO | Admitting: Physical Therapy

## 2020-10-31 ENCOUNTER — Other Ambulatory Visit: Payer: Self-pay

## 2020-10-31 DIAGNOSIS — M546 Pain in thoracic spine: Secondary | ICD-10-CM

## 2020-11-01 ENCOUNTER — Encounter: Payer: Self-pay | Admitting: Physical Therapy

## 2020-11-01 NOTE — Therapy (Signed)
[facility] [address] [place], [place], [address] Phone: [phone]   Fax:  [phone]  Physical Therapy Treatment  Patient Details  Name: Jillian [name] MRN: [id number] Date of Birth: April 11, 1941 Referring Provider (PT): Lynne Leader   Encounter Date: 10/31/2020   PT End of Session - 11/01/20 1242    Visit Number 4    Number of Visits 12    Date for PT Re-Evaluation 11/24/20    Authorization Type [employer]    PT Start Time 1300    PT Stop Time 1343    PT Time Calculation (min) 43 min    Activity Tolerance Patient tolerated treatment well    Behavior During Therapy [facility] for tasks assessed/performed           Past Medical History:  Diagnosis Date  . Allergy   . Arthritis   . Cataract    Surgery 2014  . Endometrial cancer ([place]) 02/2019  . GERD (gastroesophageal reflux disease)   . Heart murmur    per pt  . Hypertension   . Osteoporosis    Osteopenia  . Small bowel obstruction [facility])     Past Surgical History:  Procedure Laterality Date  . BIOPSY ENDOMETRIAL  02/2019   endometrium carcinoma  . BREAST ENHANCEMENT SURGERY Bilateral 2006   and face lift  . CATARACT EXTRACTION Bilateral 10/2012, 01/2013  . COLONOSCOPY     02/10/2014  . KNEE ARTHROSCOPY WITH EXCISION BAKER'S CYST Left 1990  . LAPAROSCOPIC HYSTERECTOMY    . TUBAL LIGATION  1977  . UPPER GASTROINTESTINAL ENDOSCOPY  1990s  . WISDOM TOOTH EXTRACTION  1990    There were no vitals filed for this visit.   Subjective Assessment - 11/01/20 1239    Subjective Pt with continued pain in back, variable, has not noticed much change. Still sore with overhead (shoulder)activities in mid thoracic spine.    Currently in Pain? Yes    Pain Score 4     Pain Location Back    Pain Orientation Right;Left;Mid    Pain Descriptors / Indicators Aching    Pain Type Chronic pain    Pain Onset More than a month ago    Pain Frequency Intermittent                              [facility] Adult PT Treatment/Exercise - 11/01/20 0001      Lumbar Exercises: Stretches   Single Knee to Chest Stretch 3 reps;30 seconds    Pelvic Tilt 20 reps    Other Lumbar Stretch Exercise Shoulder flexion, supine cane AAROM x 15;  Shoulder pulley x 15 flexion      Lumbar Exercises: Standing   Row 20 reps    Theraband Level (Row) Level 3 (Green)    Other Standing Lumbar Exercises shoulder flexion AROm x 15;      Lumbar Exercises: Seated   Other Seated Lumbar Exercises thoracic rotation x10      Lumbar Exercises: Supine   Bent Knee Raise 20 reps    Straight Leg Raise 10 reps    Straight Leg Raises Limitations with TA    Other Supine Lumbar Exercises OA Heel press x10 bil;      Manual Therapy   Joint Mobilization Thoracic PA mobs.    Soft tissue mobilization STM/DTm to BIl rhomoboid and mid thoracic region  PT Education - 11/01/20 1242    Education Details Reviewed HEP    Person(s) Educated Patient    Methods Explanation;Demonstration;Verbal cues    Comprehension Verbalized understanding;Returned demonstration;Verbal cues required;Tactile cues required            PT Short Term Goals - 10/15/20 1456      PT SHORT TERM GOAL #1   Title Pt to be independent with initial HEP    Time 2    Period Weeks    Status New    Target Date 10/27/20             PT Long Term Goals - 10/15/20 1457      PT LONG TERM GOAL #1   Title Pt to be independent with final HEP    Time 6    Period Weeks    Status New    Target Date 11/24/20      PT LONG TERM GOAL #2   Title Pt to report decreased pain in thoracic region to 0-2/10 with activity    Time 6    Period Weeks    Status New    Target Date 11/24/20      PT LONG TERM GOAL #3   Title Pt to demo ability for bil UE flexion/elevation without pain in thoracic spine, for improved ability for IADLS, reaching, lifting,    Time 6    Period Weeks    Status New     Target Date 11/24/20      PT LONG TERM GOAL #4   Title Pt to demo soft tissue restrictions to be WNL for thoracic and scapular region.    Time 6    Period Weeks    Status New    Target Date 11/24/20                 Plan - 11/01/20 1243    Clinical Impression Statement Pt continues to have variable pain in mid t-spine. Continued focus on gentile thoracic mobility for improving pain. Plan to see pt for another 1-2 weeks, then will refer back to MD if pain not improved.    Examination-Activity Limitations Lift;Stand;Locomotion Level;Reach Overhead;Squat    Examination-Participation Restrictions Cleaning;Meal Prep;Yard Work;Community Activity;Laundry;Shop    Stability/Clinical Decision Making Stable/Uncomplicated    Rehab Potential Good    PT Frequency 2x / week    PT Duration 6 weeks    PT Treatment/Interventions ADLs/Self Care Home Management;Cryotherapy;Electrical Stimulation;Ultrasound;Traction;Moist Heat;Iontophoresis 4mg /ml Dexamethasone;DME Instruction;Gait training;Stair training;Functional mobility training;Therapeutic activities;Therapeutic exercise;Patient/family education;Balance training;Neuromuscular re-education;Manual techniques;Vasopneumatic Device;Dry needling;Passive range of motion;Spinal Manipulations;Joint Manipulations;Splinting;Taping    Consulted and Agree with Plan of Care Patient           Patient will benefit from skilled therapeutic intervention in order to improve the following deficits and impairments:  Pain,Improper body mechanics,Postural dysfunction,Increased muscle spasms,Decreased mobility,Decreased activity tolerance,Decreased range of motion,Decreased strength,Hypomobility,Impaired flexibility  Visit Diagnosis: Pain in thoracic spine     Problem List Patient Active Problem List   Diagnosis Date Noted  . Moderate protein-calorie malnutrition ([place]) 10/03/2020  . Endometrial cancer ([place]) 01/25/2020  . Postmenopausal bleeding 03/04/2019  .  ABDOMINAL PAIN, UNSPECIFIED SITE 08/20/2008  . DUODENITIS 08/17/2008  . DIVERTICULOSIS, COLON 08/17/2008  . ABDOMINAL BLOATING 08/17/2008  . DIARRHEA 08/17/2008  . GASTRITIS, HX OF 08/17/2008   Lyndee Hensen, PT, DPT 12:46 PM  11/01/20    [employer] [place], [place], [address] Phone: [phone]   Fax:  [phone]  Name:  Jillian [name] MRN: [id number] Date of Birth: [birth year]

## 2020-11-02 ENCOUNTER — Other Ambulatory Visit: Payer: Self-pay

## 2020-11-02 ENCOUNTER — Encounter: Payer: Self-pay | Admitting: Physical Therapy

## 2020-11-02 ENCOUNTER — Ambulatory Visit (INDEPENDENT_AMBULATORY_CARE_PROVIDER_SITE_OTHER): Payer: Medicare PPO | Admitting: Physical Therapy

## 2020-11-02 DIAGNOSIS — M546 Pain in thoracic spine: Secondary | ICD-10-CM

## 2020-11-02 NOTE — Therapy (Signed)
[facility] [address] [place], [place], [address] Phone: [phone]   Fax:  [phone]  Physical Therapy Treatment  Patient Details  Name: Jillian [name] MRN: [id number] Date of Birth: 12/22/1940 Referring Provider (PT): Lynne Leader   Encounter Date: 11/02/2020   PT End of Session - 11/02/20 1505    Visit Number 5    Number of Visits 12    Date for PT Re-Evaluation 11/24/20    Authorization Type [employer]    PT Start Time 1300    PT Stop Time 1341    PT Time Calculation (min) 41 min    Activity Tolerance Patient tolerated treatment well    Behavior During Therapy [facility] for tasks assessed/performed           Past Medical History:  Diagnosis Date  . Allergy   . Arthritis   . Cataract    Surgery 2014  . Endometrial cancer ([place]) 02/2019  . GERD (gastroesophageal reflux disease)   . Heart murmur    per pt  . Hypertension   . Osteoporosis    Osteopenia  . Small bowel obstruction [facility])     Past Surgical History:  Procedure Laterality Date  . BIOPSY ENDOMETRIAL  02/2019   endometrium carcinoma  . BREAST ENHANCEMENT SURGERY Bilateral 2006   and face lift  . CATARACT EXTRACTION Bilateral 10/2012, 01/2013  . COLONOSCOPY     02/10/2014  . KNEE ARTHROSCOPY WITH EXCISION BAKER'S CYST Left 1990  . LAPAROSCOPIC HYSTERECTOMY    . TUBAL LIGATION  1977  . UPPER GASTROINTESTINAL ENDOSCOPY  1990s  . WISDOM TOOTH EXTRACTION  1990    There were no vitals filed for this visit.   Subjective Assessment - 11/02/20 1501    Subjective Pt states mild pain today, variable with increased activities, reaching .    Currently in Pain? Yes    Pain Score 3     Pain Location Back    Pain Orientation Right;Left;Mid    Pain Descriptors / Indicators Aching    Pain Type Chronic pain    Pain Onset More than a month ago    Pain Frequency Intermittent                             [facility] Adult PT Treatment/Exercise - 11/02/20 0001       Lumbar Exercises: Stretches   Single Knee to Chest Stretch 3 reps;30 seconds    Pelvic Tilt 20 reps    Other Lumbar Stretch Exercise Shoulder flexion, supine cane AAROM x 15;  Shoulder pulley x 15 flexion      Lumbar Exercises: Standing   Row 20 reps    Theraband Level (Row) Level 3 (Green)    Other Standing Lumbar Exercises shoulder flexion AROm x 15;    Other Standing Lumbar Exercises Wall slides2 UE x 15;  UE lift offs at wall x 10      Lumbar Exercises: Supine   Bent Knee Raise 20 reps    Straight Leg Raise 10 reps    Straight Leg Raises Limitations with TA    Other Supine Lumbar Exercises OA Heel press x10 bil;    Other Supine Lumbar Exercises Shoulder horizontal Abd with TA x 10 bil;      Lumbar Exercises: Quadruped   [place] 15 reps      Manual Therapy   Joint Mobilization Thoracic PA mobs.    Soft tissue mobilization --  PT Short Term Goals - 11/02/20 1505      PT SHORT TERM GOAL #1   Title Pt to be independent with initial HEP    Time 2    Period Weeks    Status Achieved    Target Date 10/27/20             PT Long Term Goals - 10/15/20 1457      PT LONG TERM GOAL #1   Title Pt to be independent with final HEP    Time 6    Period Weeks    Status New    Target Date 11/24/20      PT LONG TERM GOAL #2   Title Pt to report decreased pain in thoracic region to 0-2/10 with activity    Time 6    Period Weeks    Status New    Target Date 11/24/20      PT LONG TERM GOAL #3   Title Pt to demo ability for bil UE flexion/elevation without pain in thoracic spine, for improved ability for IADLS, reaching, lifting,    Time 6    Period Weeks    Status New    Target Date 11/24/20      PT LONG TERM GOAL #4   Title Pt to demo soft tissue restrictions to be WNL for thoracic and scapular region.    Time 6    Period Weeks    Status New    Target Date 11/24/20                 Plan - 11/02/20 1506    Clinical  Impression Statement Pt with minimal soreness wtih activities today. Improved ability today for overhead ROM and activity without pain.    Examination-Activity Limitations Lift;Stand;Locomotion Level;Reach Overhead;Squat    Examination-Participation Restrictions Cleaning;Meal Prep;Yard Work;Community Activity;Laundry;Shop    Stability/Clinical Decision Making Stable/Uncomplicated    Rehab Potential Good    PT Frequency 2x / week    PT Duration 6 weeks    PT Treatment/Interventions ADLs/Self Care Home Management;Cryotherapy;Electrical Stimulation;Ultrasound;Traction;Moist Heat;Iontophoresis 4mg /ml Dexamethasone;DME Instruction;Gait training;Stair training;Functional mobility training;Therapeutic activities;Therapeutic exercise;Patient/family education;Balance training;Neuromuscular re-education;Manual techniques;Vasopneumatic Device;Dry needling;Passive range of motion;Spinal Manipulations;Joint Manipulations;Splinting;Taping    Consulted and Agree with Plan of Care Patient           Patient will benefit from skilled therapeutic intervention in order to improve the following deficits and impairments:  Pain,Improper body mechanics,Postural dysfunction,Increased muscle spasms,Decreased mobility,Decreased activity tolerance,Decreased range of motion,Decreased strength,Hypomobility,Impaired flexibility  Visit Diagnosis: Pain in thoracic spine     Problem List Patient Active Problem List   Diagnosis Date Noted  . Moderate protein-calorie malnutrition ([place]) 10/03/2020  . Endometrial cancer ([place]) 01/25/2020  . Postmenopausal bleeding 03/04/2019  . ABDOMINAL PAIN, UNSPECIFIED SITE 08/20/2008  . DUODENITIS 08/17/2008  . DIVERTICULOSIS, COLON 08/17/2008  . ABDOMINAL BLOATING 08/17/2008  . DIARRHEA 08/17/2008  . GASTRITIS, HX OF 08/17/2008    Lyndee Hensen, PT, DPT 3:07 PM  11/02/20    [place], [place],  [address] Phone: [phone]   Fax:  [phone]  Name: LETIA GUIDRY MRN: [id number] Date of Birth: 1941/03/01

## 2020-11-07 ENCOUNTER — Ambulatory Visit (INDEPENDENT_AMBULATORY_CARE_PROVIDER_SITE_OTHER): Payer: Medicare PPO | Admitting: Physical Therapy

## 2020-11-07 ENCOUNTER — Encounter: Payer: Self-pay | Admitting: Physical Therapy

## 2020-11-07 ENCOUNTER — Other Ambulatory Visit: Payer: Self-pay

## 2020-11-07 DIAGNOSIS — M546 Pain in thoracic spine: Secondary | ICD-10-CM | POA: Diagnosis not present

## 2020-11-07 NOTE — Patient Instructions (Signed)
Access Code: [email or web address] URL: [email or web address] Date: 11/07/2020 Prepared by: Lyndee Hensen  Exercises Supine Shoulder Flexion Extension AAROM with [place] - 1 x daily - 1 sets - 10-15 reps Supine Single Knee to Chest Stretch - 2 x daily - 3 reps - 30 hold Supine Posterior Pelvic Tilt - 2 x daily - 1 sets - 10 reps Supine March - 1 x daily - 1 sets - 10 reps Straight Leg Raise - 1 x daily - 1 sets - 10 reps Supine Heel Slide with Arm Flexion - 1 x daily - 2 sets - 5 reps - 20 hold Standing Row with Anchored Resistance - 1 x daily - 2 sets - 10 reps

## 2020-11-07 NOTE — Therapy (Addendum)
[facility] [address] [place], [place], [address] Phone: [phone]   Fax:  [phone]  Physical Therapy Treatment  Patient Details  Name: Jillian [name] MRN: [id number] Date of Birth: 1941/08/17 Referring Provider (PT): Lynne Leader   Encounter Date: 11/07/2020   PT End of Session - 11/07/20 1447    Visit Number 6    Number of Visits 12    Date for PT Re-Evaluation 11/24/20    Authorization Type [employer]    PT Start Time 4967    PT Stop Time 1425    PT Time Calculation (min) 40 min    Activity Tolerance Patient tolerated treatment well    Behavior During Therapy [facility] for tasks assessed/performed           Past Medical History:  Diagnosis Date  . Allergy   . Arthritis   . Cataract    Surgery 2014  . Endometrial cancer ([place]) 02/2019  . GERD (gastroesophageal reflux disease)   . Heart murmur    per pt  . Hypertension   . Osteoporosis    Osteopenia  . Small bowel obstruction [facility])     Past Surgical History:  Procedure Laterality Date  . BIOPSY ENDOMETRIAL  02/2019   endometrium carcinoma  . BREAST ENHANCEMENT SURGERY Bilateral 2006   and face lift  . CATARACT EXTRACTION Bilateral 10/2012, 01/2013  . COLONOSCOPY     02/10/2014  . KNEE ARTHROSCOPY WITH EXCISION BAKER'S CYST Left 1990  . LAPAROSCOPIC HYSTERECTOMY    . TUBAL LIGATION  1977  . UPPER GASTROINTESTINAL ENDOSCOPY  1990s  . WISDOM TOOTH EXTRACTION  1990    There were no vitals filed for this visit.   Subjective Assessment - 11/07/20 1446    Subjective Pt states continued pain. States with working around the house, "sometimes my whole ribcage hurts"    Currently in Pain? Yes    Pain Score 3     Pain Location Back    Pain Orientation Right;Left;Mid    Pain Descriptors / Indicators Aching    Pain Type Chronic pain    Pain Onset More than a month ago    Pain Frequency Intermittent    Aggravating Factors  UE activity, overhead activity, bending forward,  reaching.                             [facility] Adult PT Treatment/Exercise - 11/07/20 1353      Lumbar Exercises: Stretches   Single Knee to Chest Stretch 3 reps;30 seconds    Pelvic Tilt 20 reps    Other Lumbar Stretch Exercise Shoulder flexion, supine cane AAROM x 15;  Shoulder pulley x 15 flexion      Lumbar Exercises: Standing   Row 20 reps    Theraband Level (Row) Level 3 (Green)    Other Standing Lumbar Exercises shoulder flexion AROm x 15;    Other Standing Lumbar Exercises UE lift offs at wall x 10;  Bil UE ER YTB x 15;      Lumbar Exercises: Seated   Other Seated Lumbar Exercises thoracic rotation x10      Lumbar Exercises: Supine   Bent Knee Raise --    Straight Leg Raise 10 reps    Straight Leg Raises Limitations with TA    Other Supine Lumbar Exercises OA Heel press x10 bil;    Other Supine Lumbar Exercises Shoulder horizontal Abd with TA x 10 bil;  Lumbar Exercises: Quadruped   [place]/Old Horse 15 reps      Manual Therapy   Joint Mobilization Thoracic and lumbar PA mobs.    Soft tissue mobilization STM/DTm to BIl rhomoboid and mid thoracic region                  PT Education - 11/07/20 1410    Education Details Reviewed final HEP    Person(s) Educated Patient    Methods Explanation;Demonstration;Tactile cues;Verbal cues;Handout    Comprehension Verbalized understanding;Returned demonstration;Verbal cues required;Tactile cues required;Need further instruction            PT Short Term Goals - 11/02/20 1505      PT SHORT TERM GOAL #1   Title Pt to be independent with initial HEP    Time 2    Period Weeks    Status Achieved    Target Date 10/27/20             PT Long Term Goals - 11/07/20 1448      PT LONG TERM GOAL #1   Title Pt to be independent with final HEP    Time 6    Period Weeks    Status Achieved      PT LONG TERM GOAL #2   Title Pt to report decreased pain in thoracic region to 0-2/10 with activity     Time 6    Period Weeks    Status On-going      PT LONG TERM GOAL #3   Title Pt to demo ability for bil UE flexion/elevation without pain in thoracic spine, for improved ability for IADLS, reaching, lifting,    Time 6    Period Weeks    Status Partially Met      PT LONG TERM GOAL #4   Title Pt to demo soft tissue restrictions to be WNL for thoracic and scapular region.    Time 6    Period Weeks    Status Achieved                 Plan - 11/07/20 1449    Clinical Impression Statement Pt with good improvment with ability for ther ex. She continues to have pain in thoracic region, with bending, reaching forward, as well as with increased UE activity out in front of her or up overhead. No pain when thoracic spine supported on table for these activities. Pt continues to have pain that comes and goes with activity, and with ROM of her T-spine. She has not had change in back pain from PT sessions thus far. Recommend pt return to MD for further assessment. She has f/u visit on 11/14/20.    Examination-Activity Limitations Lift;Stand;Locomotion Level;Reach Overhead;Squat    Examination-Participation Restrictions Cleaning;Meal Prep;Yard Work;Community Activity;Laundry;Shop    Stability/Clinical Decision Making Stable/Uncomplicated    Rehab Potential Good    PT Frequency 2x / week    PT Duration 6 weeks    PT Treatment/Interventions ADLs/Self Care Home Management;Cryotherapy;Electrical Stimulation;Ultrasound;Traction;Moist Heat;Iontophoresis 4mg /ml Dexamethasone;DME Instruction;Gait training;Stair training;Functional mobility training;Therapeutic activities;Therapeutic exercise;Patient/family education;Balance training;Neuromuscular re-education;Manual techniques;Vasopneumatic Device;Dry needling;Passive range of motion;Spinal Manipulations;Joint Manipulations;Splinting;Taping    Consulted and Agree with Plan of Care Patient           Patient will benefit from skilled therapeutic  intervention in order to improve the following deficits and impairments:  Pain,Improper body mechanics,Postural dysfunction,Increased muscle spasms,Decreased mobility,Decreased activity tolerance,Decreased range of motion,Decreased strength,Hypomobility,Impaired flexibility  Visit Diagnosis: Pain in thoracic spine     Problem List Patient  Active Problem List   Diagnosis Date Noted  . Moderate protein-calorie malnutrition ([place]) 10/03/2020  . Endometrial cancer ([place]) 01/25/2020  . Postmenopausal bleeding 03/04/2019  . ABDOMINAL PAIN, UNSPECIFIED SITE 08/20/2008  . DUODENITIS 08/17/2008  . DIVERTICULOSIS, COLON 08/17/2008  . ABDOMINAL BLOATING 08/17/2008  . DIARRHEA 08/17/2008  . GASTRITIS, HX OF 08/17/2008    Lyndee Hensen, PT, DPT 2:52 PM  11/07/20    [facility] [place], [place], [address] Phone: [phone]   Fax:  [phone]  Name: SHANA ZAVALETA MRN: [id number] Date of Birth: [birth year]   PHYSICAL THERAPY DISCHARGE SUMMARY  Visits from Start of Care :6 Plan: Patient agrees to discharge.  Patient goals were partially met. Patient is being discharged due to not returning since the last visit.  ?????     Referred back to Dr.   Lyndee Hensen, PT, DPT 1:15 PM  02/14/21

## 2020-11-09 ENCOUNTER — Encounter: Payer: Medicare PPO | Admitting: Physical Therapy

## 2020-11-09 DIAGNOSIS — I10 Essential (primary) hypertension: Secondary | ICD-10-CM | POA: Diagnosis not present

## 2020-11-11 NOTE — Progress Notes (Signed)
   I, Peterson Lombard, LAT, ATC acting as a scribe for Lynne Leader, MD.  Jillian [name] is a 80 y.o. female who presents to [place] at [facility] today for chronic bilat thoracic back pain. Pt was last seen by Dr. Georgina Snell on 10/03/20 and was advised to increased protein intake and was referred to PT of which she's completed 6 visits, last visit 11/07/20. Today, pt reports not much difference in her pain. Pt c/o increased pain w/ activity, but no pain at night or in the mornings.    Pertinent review of systems: No fevers or chills  Relevant historical information: History of endometrial cancer.  This was treated with resection chemotherapy and radiation therapy.  Radiation therapy completed January 2021.   Exam:  BP 107/66 (BP Location: Right Arm, Patient Position: Sitting, Cuff Size: Normal)   Pulse 63   Ht 5\' 4"  (1.626 m)   Wt 133 lb 9 oz (60.6 kg)   BMI 22.93 kg/m  General: Well Developed, well nourished, and in no acute distress.   MSK: T-spine thoracic kyphosis otherwise normal-appearing Nontender midline.  Tender palpation paraspinal musculature.  Decreased thoracic motion.  Upper extremity strength is intact.    Lab and Radiology Results EXAM: THORACIC SPINE 2 VIEWS  COMPARISON: 11/05/2019  FINDINGS: Dual lumen RIGHT jugular Port-A-Cath tip projecting over SVC near cavoatrial junction.  Osseous demineralization.  12 pairs of ribs.  Scattered mild disc space narrowing and endplate spur formation.  Vertebral body heights maintained.  No fracture, subluxation or bone destruction.  IMPRESSION: Osseous demineralization and mild degenerative disc disease changes thoracic spine.  No acute abnormalities.   Electronically Signed By: Lavonia Dana M.D. On: 09/22/2020 14:16    Assessment and Plan: 80 y.o. female with chronic thoracic back pain.  This has failed conservative management at this point.  Discussed options.  Plan for MRI to  further evaluate cause of pain.  This is especially important given history of cancer.  X-ray was obtained in mid December which was normal as noted above. Recheck following MRI.  Recommend continuing home exercise program but pausing on formal physical therapy until after MRI.   PDMP not reviewed this encounter. Orders Placed This Encounter  Procedures  . MR THORACIC SPINE WO CONTRAST    Standing Status:   Future    Standing Expiration Date:   11/14/2021    Order Specific Question:   What is the patient's sedation requirement?    Answer:   No Sedation    Order Specific Question:   Does the patient have a pacemaker or implanted devices?    Answer:   No    Order Specific Question:   Preferred imaging location?    Answer:   [facility] (table limit-550lbs)   No orders of the defined types were placed in this encounter.    Discussed warning signs or symptoms. Please see discharge instructions. Patient expresses understanding.   The above documentation has been reviewed and is accurate and complete Lynne Leader, M.D.  Total encounter time 20 minutes including face-to-face time with the patient and, reviewing past medical record, and charting on the date of service.   Treatment plan and options.

## 2020-11-14 ENCOUNTER — Other Ambulatory Visit: Payer: Self-pay

## 2020-11-14 ENCOUNTER — Ambulatory Visit (INDEPENDENT_AMBULATORY_CARE_PROVIDER_SITE_OTHER): Payer: Medicare PPO | Admitting: Family Medicine

## 2020-11-14 VITALS — BP 107/66 | HR 63 | Ht 64.0 in | Wt 133.6 lb

## 2020-11-14 DIAGNOSIS — G8929 Other chronic pain: Secondary | ICD-10-CM | POA: Diagnosis not present

## 2020-11-14 DIAGNOSIS — M546 Pain in thoracic spine: Secondary | ICD-10-CM | POA: Diagnosis not present

## 2020-11-14 NOTE — Patient Instructions (Signed)
Thank you for coming in today.  Plan for MRI.   Continue home exercises.    Recheck following MRI.

## 2020-11-15 DIAGNOSIS — J309 Allergic rhinitis, unspecified: Secondary | ICD-10-CM | POA: Diagnosis not present

## 2020-11-15 DIAGNOSIS — Z1231 Encounter for screening mammogram for malignant neoplasm of breast: Secondary | ICD-10-CM | POA: Diagnosis not present

## 2020-11-15 DIAGNOSIS — Z0000 Encounter for general adult medical examination without abnormal findings: Secondary | ICD-10-CM | POA: Diagnosis not present

## 2020-11-15 DIAGNOSIS — Z8249 Family history of ischemic heart disease and other diseases of the circulatory system: Secondary | ICD-10-CM | POA: Diagnosis not present

## 2020-11-15 DIAGNOSIS — R7303 Prediabetes: Secondary | ICD-10-CM | POA: Diagnosis not present

## 2020-11-15 DIAGNOSIS — I10 Essential (primary) hypertension: Secondary | ICD-10-CM | POA: Diagnosis not present

## 2020-11-15 DIAGNOSIS — D72819 Decreased white blood cell count, unspecified: Secondary | ICD-10-CM | POA: Diagnosis not present

## 2020-11-15 DIAGNOSIS — T23271A Burn of second degree of right wrist, initial encounter: Secondary | ICD-10-CM | POA: Diagnosis not present

## 2020-11-18 ENCOUNTER — Other Ambulatory Visit: Payer: Self-pay | Admitting: Internal Medicine

## 2020-11-18 DIAGNOSIS — Z8249 Family history of ischemic heart disease and other diseases of the circulatory system: Secondary | ICD-10-CM

## 2020-12-02 ENCOUNTER — Ambulatory Visit
Admission: RE | Admit: 2020-12-02 | Discharge: 2020-12-02 | Disposition: A | Discharge status: Home / Self Care | Payer: Medicare PPO | Source: Ambulatory Visit | Attending: Family Medicine | Admitting: Family Medicine

## 2020-12-02 DIAGNOSIS — M546 Pain in thoracic spine: Secondary | ICD-10-CM | POA: Diagnosis not present

## 2020-12-02 DIAGNOSIS — G8929 Other chronic pain: Secondary | ICD-10-CM

## 2020-12-05 NOTE — Progress Notes (Signed)
MRI thoracic spine shows a little bit of bulging disks but no herniation or stenosis.  No fractures or severe arthritis present.  No suspicious bone lesions.  No concern for cancer in the spine.  Consider recheck in clinic to go over the MRI results and discuss potential next steps.

## 2020-12-05 NOTE — Progress Notes (Signed)
I, Wendy Poet, LAT, ATC, am serving as scribe for Dr. Lynne Leader.  Jillian [name] is a 80 y.o. female who presents to [place] at [facility] today for chronic bilateral thoracic back pain. Pt was last seen by Dr. Georgina Snell on 11/14/20 and was advised to proceed w/ MRI and continue HEP. She has completed 6 PT sessions for these symptoms but did not notice much improvement in her symptoms w/ PT.  Today, pt reports that her symptoms are about the same w/ no change noted.  She states that she is now having low back pain and L hip and L knee pain.  The low back pain began after PT and now she has been having pain from her L lateral buttock running along her lateral thigh to her L knee.  She denies any numbness/tingling into her L LE.  Dx imaging: 12/02/20 T-spine MRI  09/22/20 T-spine XR  Pertinent review of systems: No fevers or chills  Relevant historical information: Endometrial cancer   Exam:  BP 110/64 (BP Location: Right Arm, Patient Position: Sitting, Cuff Size: Normal)   Pulse 74   Ht 5\' 4"  (1.626 m)   Wt 134 lb 9.6 oz (61.1 kg)   SpO2 99%   BMI 23.10 kg/m  General: Well Developed, well nourished, and in no acute distress.   MSK: T-spine nontender midline.  Mildly tender palpation perispinal musculature right T-spine. L-spine normal-appearing Tender palpation midline lower lumbar spine. Decreased lumbar motion. Strength is intact. Reflexes are intact. Positive left-sided slump test.    Lab and Radiology Results  MR THORACIC SPINE WO CONTRAST  Result Date: 12/02/2020 CLINICAL DATA:  Mid back pain EXAM: MRI THORACIC SPINE WITHOUT CONTRAST TECHNIQUE: Multiplanar, multisequence MR imaging of the thoracic spine was performed. No intravenous contrast was administered. COMPARISON:  None. FINDINGS: Motion artifact is present. Alignment: Anteroposterior alignment is maintained. Vertebrae: No compression deformity. No significant marrow edema. No suspicious osseous  lesion. Cord:  No abnormal signal. Paraspinal and other soft tissues: Unremarkable. Disc levels: Intervertebral disc heights and signal maintained. Few trace disc bulges or protrusions. No significant disc herniation or stenosis at any level. IMPRESSION: No significant degenerative changes of the thoracic spine. Electronically Signed   By: Macy Mis M.D.   On: 12/02/2020 16:29   I, Lynne Leader, personally (independently) visualized and performed the interpretation of the images attached in this note.  X-ray images L-spine obtained today personally and independently interpreted Spondylosis and facet DJD L5-S1.  No fractures or malalignment. Await formal radiology review     Assessment and Plan: 80 y.o. female with low back pain with left sciatica.  New issue in the setting of thoracic back pain.  Plan for course of prednisone and gabapentin.  Check back in 3 weeks.  Thoracic back pain not well understood.  Patient has failed conservative management.  MRI T-spine largely normal.  Pain thought to be more muscular than anything else.   PDMP not reviewed this encounter. Orders Placed This Encounter  Procedures  . DG Lumbar Spine 2-3 Views    Standing Status:   Future    Number of Occurrences:   1    Standing Expiration Date:   12/06/2021    Order Specific Question:   Reason for Exam (SYMPTOM  OR DIAGNOSIS REQUIRED)    Answer:   eval lumbar pain and left sciatica    Order Specific Question:   Preferred imaging location?    Answer:   [facility]  Meds ordered this encounter  Medications  . predniSONE (DELTASONE) 10 MG tablet    Sig: Take 3 tablets (30 mg total) by mouth daily with breakfast.    Dispense:  15 tablet    Refill:  0  . gabapentin (NEURONTIN) 100 MG capsule    Sig: Take 1-3 capsules (100-300 mg total) by mouth 3 (three) times daily as needed (nerve pain down leg).    Dispense:  90 capsule    Refill:  3     Discussed warning signs or symptoms. Please see  discharge instructions. Patient expresses understanding.   The above documentation has been reviewed and is accurate and complete Lynne Leader, M.D.

## 2020-12-06 ENCOUNTER — Ambulatory Visit (INDEPENDENT_AMBULATORY_CARE_PROVIDER_SITE_OTHER): Payer: Medicare PPO

## 2020-12-06 ENCOUNTER — Encounter: Payer: Self-pay | Admitting: Family Medicine

## 2020-12-06 ENCOUNTER — Ambulatory Visit (INDEPENDENT_AMBULATORY_CARE_PROVIDER_SITE_OTHER): Payer: Medicare PPO | Admitting: Family Medicine

## 2020-12-06 ENCOUNTER — Other Ambulatory Visit: Payer: Self-pay

## 2020-12-06 VITALS — BP 110/64 | HR 74 | Ht 64.0 in | Wt 134.6 lb

## 2020-12-06 DIAGNOSIS — M5416 Radiculopathy, lumbar region: Secondary | ICD-10-CM

## 2020-12-06 DIAGNOSIS — G8929 Other chronic pain: Secondary | ICD-10-CM | POA: Diagnosis not present

## 2020-12-06 DIAGNOSIS — M545 Low back pain, unspecified: Secondary | ICD-10-CM | POA: Diagnosis not present

## 2020-12-06 DIAGNOSIS — M546 Pain in thoracic spine: Secondary | ICD-10-CM

## 2020-12-06 MED ORDER — PREDNISONE 10 MG PO TABS: 30.0000 mg | ORAL_TABLET | Freq: Every day | ORAL | 0 refills | Status: DC

## 2020-12-06 MED ORDER — GABAPENTIN 100 MG PO CAPS: 100.0000 mg | ORAL_CAPSULE | Freq: Three times a day (TID) | ORAL | 3 refills | Status: DC | PRN

## 2020-12-06 NOTE — Patient Instructions (Addendum)
Thank you for coming in today.  Please get an Xray today before you leave  Take the medicines we discussed.   Recheck in 3 weeks.   If not improved next step may be pain management referral for thoracic pain.

## 2020-12-07 NOTE — Progress Notes (Signed)
X-ray lumbar spine looks normal to radiology

## 2020-12-12 ENCOUNTER — Ambulatory Visit
Admission: RE | Admit: 2020-12-12 | Discharge: 2020-12-12 | Disposition: A | Discharge status: Home / Self Care | Payer: Medicare PPO | Source: Ambulatory Visit | Attending: Internal Medicine | Admitting: Internal Medicine

## 2020-12-12 DIAGNOSIS — Z8249 Family history of ischemic heart disease and other diseases of the circulatory system: Secondary | ICD-10-CM

## 2020-12-19 DIAGNOSIS — Z9221 Personal history of antineoplastic chemotherapy: Secondary | ICD-10-CM | POA: Diagnosis not present

## 2020-12-19 DIAGNOSIS — Z923 Personal history of irradiation: Secondary | ICD-10-CM | POA: Diagnosis not present

## 2020-12-19 DIAGNOSIS — Z9889 Other specified postprocedural states: Secondary | ICD-10-CM | POA: Diagnosis not present

## 2020-12-19 DIAGNOSIS — R918 Other nonspecific abnormal finding of lung field: Secondary | ICD-10-CM | POA: Diagnosis not present

## 2020-12-19 DIAGNOSIS — Z8542 Personal history of malignant neoplasm of other parts of uterus: Secondary | ICD-10-CM | POA: Diagnosis not present

## 2020-12-19 DIAGNOSIS — Z08 Encounter for follow-up examination after completed treatment for malignant neoplasm: Secondary | ICD-10-CM | POA: Diagnosis not present

## 2020-12-19 DIAGNOSIS — C541 Malignant neoplasm of endometrium: Secondary | ICD-10-CM | POA: Diagnosis not present

## 2020-12-22 DIAGNOSIS — R911 Solitary pulmonary nodule: Secondary | ICD-10-CM | POA: Diagnosis not present

## 2020-12-22 DIAGNOSIS — B353 Tinea pedis: Secondary | ICD-10-CM | POA: Diagnosis not present

## 2020-12-22 DIAGNOSIS — M545 Low back pain, unspecified: Secondary | ICD-10-CM | POA: Diagnosis not present

## 2020-12-22 DIAGNOSIS — M4810 Ankylosing hyperostosis [Forestier], site unspecified: Secondary | ICD-10-CM | POA: Diagnosis not present

## 2020-12-23 DIAGNOSIS — M4810 Ankylosing hyperostosis [Forestier], site unspecified: Secondary | ICD-10-CM | POA: Diagnosis not present

## 2020-12-27 ENCOUNTER — Ambulatory Visit (INDEPENDENT_AMBULATORY_CARE_PROVIDER_SITE_OTHER): Payer: Medicare PPO | Admitting: Family Medicine

## 2020-12-27 ENCOUNTER — Other Ambulatory Visit: Payer: Self-pay

## 2020-12-27 VITALS — BP 111/65 | HR 62 | Ht 64.0 in | Wt 134.8 lb

## 2020-12-27 DIAGNOSIS — G8929 Other chronic pain: Secondary | ICD-10-CM

## 2020-12-27 DIAGNOSIS — M4810 Ankylosing hyperostosis [Forestier], site unspecified: Secondary | ICD-10-CM | POA: Diagnosis not present

## 2020-12-27 DIAGNOSIS — M546 Pain in thoracic spine: Secondary | ICD-10-CM

## 2020-12-27 NOTE — Patient Instructions (Signed)
Thank you for coming in today.  You should hear soon about the CT scan.   You may find that a Thoracic brace helps.   Recheck following CT scan.

## 2020-12-27 NOTE — Progress Notes (Signed)
I, Wendy Poet, LAT, ATC, am serving as scribe for Dr. Lynne Leader.  Jillian [name] is a 80 y.o. female who presents to [place] at [facility] today for f/u of chronic mid-and low back pain.  She was last seen by Dr. Georgina Snell on 12/06/20 and noted newer LBP radiating from her L lateral buttock to her L lateral thigh/lateral knee and was prescribed prednisone and gabapentin.  Since her last visit, pt reports the low back feels better and mid-back is about the same. Pt notes as the day goes on and she's on her feet more her back will start bothering her. Pt reports that the lateral buttock/hip/thigh pain has resolved.  She continues to have considerable pain in her midthoracic region especially with prolonged standing.  She has had trials of physical therapy which did not help much.  Diagnostic testing: L-spine XR- 3/122; T-spine MRI- 12/02/20   Pertinent review of systems: No fevers or chills  Relevant historical information: Endometrial cancer history.   Exam:  BP 111/65 (BP Location: Right Arm, Patient Position: Sitting, Cuff Size: Normal)   Pulse 62   Ht 5\' 4"  (1.626 m)   Wt 134 lb 12.8 oz (61.1 kg)   SpO2 100%   BMI 23.14 kg/m  General: Well Developed, well nourished, and in no acute distress.   MSK: Plan: Thoracic kyphosis.  Tender palpation thoracic paraspinal musculature bilaterally worse on the left.  Nontender midline.    Lab and Radiology Results DG Lumbar Spine 2-3 Views  Result Date: 12/07/2020 CLINICAL DATA:  Evaluate lumbar pain and left sciatica. EXAM: LUMBAR SPINE - 2-3 VIEW COMPARISON:  MRI lumbar spine December 02, 2020. FINDINGS: There is no evidence of lumbar spine fracture. Alignment is normal. Intervertebral disc spaces are maintained. IMPRESSION: No acute osseous abnormality. Electronically Signed   By: Dahlia Bailiff MD   On: 12/07/2020 13:04   MR THORACIC SPINE WO CONTRAST  Result Date: 12/02/2020 CLINICAL DATA:  Mid back pain EXAM: MRI  THORACIC SPINE WITHOUT CONTRAST TECHNIQUE: Multiplanar, multisequence MR imaging of the thoracic spine was performed. No intravenous contrast was administered. COMPARISON:  None. FINDINGS: Motion artifact is present. Alignment: Anteroposterior alignment is maintained. Vertebrae: No compression deformity. No significant marrow edema. No suspicious osseous lesion. Cord:  No abnormal signal. Paraspinal and other soft tissues: Unremarkable. Disc levels: Intervertebral disc heights and signal maintained. Few trace disc bulges or protrusions. No significant disc herniation or stenosis at any level. IMPRESSION: No significant degenerative changes of the thoracic spine. Electronically Signed   By: Macy Mis M.D.   On: 12/02/2020 16:29   CT CARDIAC SCORING ([facility] LOCATIONS ONLY)  Result Date: 12/12/2020 CLINICAL DATA:  80 year old Caucasian female with risk factors for coronary artery disease including hypercholesterolemia, hypertension and former smoking. Personal history of endometrial cancer for which she underwent hysterectomy, radiation therapy and chemotherapy. EXAM: CT CARDIAC CORONARY ARTERY CALCIUM SCORE TECHNIQUE: Non-contrast imaging through the heart was performed using prospective ECG gating. Image post processing was performed on an independent workstation, allowing for quantitative analysis of the heart and coronary arteries. Note that this exam targets the heart and the chest was not imaged in its entirety. COMPARISON:  None. FINDINGS: CORONARY CALCIUM SCORES: Left Main: 0 LAD: 0 LCx: 0 RCA: 0 Total Agatston Score: 0 MESA database percentile: 0 AORTA MEASUREMENTS: Ascending Aorta: 34 mm Descending Aorta: 28 mm OTHER FINDINGS: BILATERAL saline breast implants which are intact. Prominent epicardial fat. Port-A-Cath tip at the cavoatrial junction. In the posterolateral  RIGHT LOWER LOBE there is a 4 x 4 mm noncalcified nodule (4 mm mean diameter) on image 32 of series 9. Visualized lung parenchyma otherwise  clear. Osseous demineralization. Degenerative changes and DISH involving the visualized thoracic spine. IMPRESSION: 1. Total coronary artery calcium Agatston score of 0. This places the patient in the 0 percentile for age and gender matched controls, indicating a low risk study. 2. 4 mm noncalcified nodule in the posterolateral RIGHT LOWER LOBE. No follow-up needed if patient is low-risk. Non-contrast chest CT can be considered in 12 months if patient is high-risk. This recommendation follows the consensus statement: Guidelines for Management of Incidental Pulmonary Nodules Detected on CT Images: From the Fleischner Society 2017; Radiology 2017; 284:228-243. 3. No significant extracardiac findings otherwise. Electronically Signed   By: Evangeline Dakin M.D.   On: 12/12/2020 12:53   I, Lynne Leader, personally (independently) visualized and performed the interpretation of the images attached in this note.  CT cardiac scoring shows incidental DISH pattern on thoracic spine visible on CT scan. This was not visible on x-ray thoracic spine or on MRI T-spine.  However MRI T-spine images are motion degraded and in my opinion not very usable.    Assessment and Plan: 80 y.o. female with thoracic back pain.  Failing conservative management.  X-ray and MRI did not show severe degenerative changes.  MRI was motion degraded and as noted above in my opinion is not usable to evaluate for significant degenerative changes.  Patient had a cardiac CT scan and a portion of the thoracic spine is incidentally visualized and on CT scan she is much more degenerative changes than is apparent on previous images.  She has a DISH pattern.  This would explain her pain.   After discussion plan for dedicated CT scan of T-spine to further evaluate DISH and for potential thoracic facet joint injection planning.  Recheck following CT scan.   PDMP not reviewed this encounter. Orders Placed This Encounter  Procedures  . CT THORACIC  SPINE WO CONTRAST    Standing Status:   Future    Standing Expiration Date:   12/27/2021    Order Specific Question:   Preferred imaging location?    Answer:   [facility]   No orders of the defined types were placed in this encounter.    Discussed warning signs or symptoms. Please see discharge instructions. Patient expresses understanding.   The above documentation has been reviewed and is accurate and complete Lynne Leader, M.D.

## 2021-01-12 ENCOUNTER — Ambulatory Visit
Admission: RE | Admit: 2021-01-12 | Discharge: 2021-01-12 | Disposition: A | Discharge status: Home / Self Care | Payer: Medicare PPO | Source: Ambulatory Visit | Attending: Family Medicine | Admitting: Family Medicine

## 2021-01-12 ENCOUNTER — Other Ambulatory Visit: Payer: Self-pay

## 2021-01-12 DIAGNOSIS — G8929 Other chronic pain: Secondary | ICD-10-CM

## 2021-01-12 DIAGNOSIS — J984 Other disorders of lung: Secondary | ICD-10-CM | POA: Diagnosis not present

## 2021-01-12 DIAGNOSIS — Z8542 Personal history of malignant neoplasm of other parts of uterus: Secondary | ICD-10-CM | POA: Diagnosis not present

## 2021-01-12 DIAGNOSIS — M47814 Spondylosis without myelopathy or radiculopathy, thoracic region: Secondary | ICD-10-CM | POA: Diagnosis not present

## 2021-01-12 DIAGNOSIS — Z452 Encounter for adjustment and management of vascular access device: Secondary | ICD-10-CM | POA: Diagnosis not present

## 2021-01-12 DIAGNOSIS — M4810 Ankylosing hyperostosis [Forestier], site unspecified: Secondary | ICD-10-CM

## 2021-01-13 ENCOUNTER — Telehealth: Payer: Self-pay | Admitting: Family Medicine

## 2021-01-13 DIAGNOSIS — G8929 Other chronic pain: Secondary | ICD-10-CM

## 2021-01-13 NOTE — Progress Notes (Signed)
CT scan thoracic spine shows much more severe arthritis changes that was seen on MRI.  Recommend return to clinic to go over the results and discuss treatment plan and options. I think you will benefit from injections but will likely benefit from dedicated repeated follow-up with pain management doctor who does a lot of injections.  I will go ahead and place referral now to one of the pain management doctors who does this.

## 2021-01-13 NOTE — Telephone Encounter (Signed)
Referral

## 2021-01-16 NOTE — Progress Notes (Signed)
I, Peterson Lombard, LAT, ATC acting as a scribe for Lynne Leader, MD.  Jillian [name] is a 80 y.o. female who presents to [facility] today for chronic bilat thoracic back pain. Pt was last seen by Dr. Georgina Snell on 3/22 and was advised to plan for T-spine CT. Pt reports mid-back is about the same. Pt has good days and bad w/ increased pain as the days goes on. Pt c/o low back pain starting around the beginning of Feb w/ increased pain first thing in the morning.   Dx imaging: 01/12/21 T-spine CT  12/06/20 L-spine XR  12/02/20 T-spine MRI  Pertinent review of systems: No fevers or chills  Relevant historical information: Endometrial cancer history.   Exam:  BP (!) 90/54 (BP Location: Right Arm, Patient Position: Sitting, Cuff Size: Normal)   Pulse 68   Ht 5\' 4"  (1.626 m)   Wt 135 lb 3.2 oz (61.3 kg)   SpO2 100%   BMI 23.21 kg/m  General: Well Developed, well nourished, and in no acute distress.   MSK: T-spine normal-appearing nontender midline.    Lab and Radiology Results  CT scan thoracic spine dated April 7 images show significant mid thoracic degenerative changes consistent with DISH.  However there is one segment I believe at T9-T10 where there is not bridging osteophytes but she does have significant degenerative changes of the facet joints. Images personally independently interpreted today.  EXAM: CT THORACIC SPINE WITHOUT CONTRAST  TECHNIQUE: Multidetector CT images of the thoracic were obtained using the standard protocol without intravenous contrast.  COMPARISON:  MRI thoracic spine 12/02/2020. Thoracic spine radiographs 09/21/2020  FINDINGS: Alignment: Normal alignment of the thoracic vertebrae.  Vertebrae: No vertebral compression deformities. No focal bone lesion or bone destruction. Coarsening of medullary trabeculated consistent with osteoporosis. Degenerative changes with narrowed interspaces and endplate hypertrophic changes  throughout. Bridging osteophytes in the mid and distal thoracic spine.  Paraspinal and other soft tissues: No paraspinal soft tissue swelling or hematoma. No pleural effusion or pneumothorax. Mild scarring in the lung apices. Left apical nodule measuring 5 mm diameter. Right lower lobe nodule measuring 5 mm diameter. No airspace disease or consolidation. Central venous catheter with tip in the low SVC.  IMPRESSION: 1. No evidence of acute fracture or subluxation of the thoracic spine. 2. Degenerative changes throughout the thoracic spine. 3. Coarsening of medullary trabeculated consistent with osteoporosis. 4. Small bilateral pulmonary nodules measuring up to 5 mm diameter. No follow-up needed if patient is low-risk (and has no known or suspected primary neoplasm). Non-contrast chest CT can be considered in 12 months if patient is high-risk. This recommendation follows the consensus statement: Guidelines for Management of Incidental Pulmonary Nodules Detected on CT Images: From the Fleischner Society 2017; Radiology 2017; 284:228-243.   Electronically Signed   By: Lucienne Capers M.D.   On: 01/12/2021 21:46   Assessment and Plan: 80 y.o. female with chronic pain T-spine.  Failing conservative management.  Patient had a not very helpful thoracic spine MRI due to motion artifact.  CT scan is more helpful here showing significant DISH.Marland Kitchen  However there is one segment where there is not bridging osteophytes where she has ability to have motion where there is more significant facet DJD.  I believe this is T9-T10 however my counting may be a bit off.  This may be amenable to facet injection and potentially ablation.  Anticipate that she may need multiple injections and have already referred her to Dr.  Kiersten's pain management to proceed with chronic facet injections if needed.  She should have a follow-up appoint with him in the near future.  Discussed results of CT scan treatment  plan and options with patient who expresses understanding and agreement.    Discussed warning signs or symptoms. Please see discharge instructions. Patient expresses understanding.   The above documentation has been reviewed and is accurate and complete Lynne Leader, M.D.  Total encounter time 20 minutes including face-to-face time with the patient and, reviewing past medical record, and charting on the date of service.   CT scan results and plan

## 2021-01-17 ENCOUNTER — Encounter: Payer: Self-pay | Admitting: Physical Medicine & Rehabilitation

## 2021-01-17 ENCOUNTER — Other Ambulatory Visit: Payer: Self-pay

## 2021-01-17 ENCOUNTER — Ambulatory Visit (INDEPENDENT_AMBULATORY_CARE_PROVIDER_SITE_OTHER): Payer: Medicare PPO | Admitting: Family Medicine

## 2021-01-17 VITALS — BP 90/54 | HR 68 | Ht 64.0 in | Wt 135.2 lb

## 2021-01-17 DIAGNOSIS — M4810 Ankylosing hyperostosis [Forestier], site unspecified: Secondary | ICD-10-CM | POA: Diagnosis not present

## 2021-01-17 NOTE — Patient Instructions (Signed)
Thank you for coming in today.  You should hear from the pain management doctor's office in 2-4 weeks.   Let me know if you do not.   I have a back up plan.   Keep me updated.   Let me know if you want me to do PT for your low back.

## 2021-01-23 ENCOUNTER — Telehealth: Payer: Self-pay

## 2021-01-23 NOTE — Telephone Encounter (Signed)
Patient called stating that she was referred to pain management but when she called she was told they are affiliated with the hospital so she has to pay a copay and a facility charge that is 250 dollars each visit. Patient wants to be referred somewhere that is not affiliated with a hospital because she cannot afford to do that for each visit.

## 2021-01-25 NOTE — Telephone Encounter (Signed)
Going to place a referral to [facility] and if that does not work will have patient call her insurance to see what place is the best and we will refer there.

## 2021-01-26 NOTE — Telephone Encounter (Signed)
Great idea

## 2021-02-03 DIAGNOSIS — L570 Actinic keratosis: Secondary | ICD-10-CM | POA: Diagnosis not present

## 2021-02-03 DIAGNOSIS — R208 Other disturbances of skin sensation: Secondary | ICD-10-CM | POA: Diagnosis not present

## 2021-02-03 DIAGNOSIS — X32XXXD Exposure to sunlight, subsequent encounter: Secondary | ICD-10-CM | POA: Diagnosis not present

## 2021-02-06 DIAGNOSIS — M7918 Myalgia, other site: Secondary | ICD-10-CM | POA: Diagnosis not present

## 2021-02-06 DIAGNOSIS — G894 Chronic pain syndrome: Secondary | ICD-10-CM | POA: Diagnosis not present

## 2021-02-06 DIAGNOSIS — M5489 Other dorsalgia: Secondary | ICD-10-CM | POA: Diagnosis not present

## 2021-02-06 DIAGNOSIS — Z79891 Long term (current) use of opiate analgesic: Secondary | ICD-10-CM | POA: Diagnosis not present

## 2021-02-06 DIAGNOSIS — M47814 Spondylosis without myelopathy or radiculopathy, thoracic region: Secondary | ICD-10-CM | POA: Diagnosis not present

## 2021-02-10 ENCOUNTER — Ambulatory Visit: Payer: Medicare PPO | Admitting: Physical Medicine & Rehabilitation

## 2021-02-13 DIAGNOSIS — M549 Dorsalgia, unspecified: Secondary | ICD-10-CM | POA: Diagnosis not present

## 2021-02-13 DIAGNOSIS — M7918 Myalgia, other site: Secondary | ICD-10-CM | POA: Diagnosis not present

## 2021-02-13 DIAGNOSIS — M5489 Other dorsalgia: Secondary | ICD-10-CM | POA: Diagnosis not present

## 2021-03-15 DIAGNOSIS — M47814 Spondylosis without myelopathy or radiculopathy, thoracic region: Secondary | ICD-10-CM | POA: Diagnosis not present

## 2021-03-15 DIAGNOSIS — G894 Chronic pain syndrome: Secondary | ICD-10-CM | POA: Diagnosis not present

## 2021-03-15 DIAGNOSIS — M5489 Other dorsalgia: Secondary | ICD-10-CM | POA: Diagnosis not present

## 2021-03-15 DIAGNOSIS — M7918 Myalgia, other site: Secondary | ICD-10-CM | POA: Diagnosis not present

## 2021-03-17 ENCOUNTER — Other Ambulatory Visit: Payer: Self-pay

## 2021-03-17 ENCOUNTER — Ambulatory Visit: Payer: Medicare PPO | Attending: Internal Medicine

## 2021-03-17 ENCOUNTER — Other Ambulatory Visit (HOSPITAL_BASED_OUTPATIENT_CLINIC_OR_DEPARTMENT_OTHER): Payer: Self-pay

## 2021-03-17 DIAGNOSIS — Z23 Encounter for immunization: Secondary | ICD-10-CM

## 2021-03-17 MED ORDER — PFIZER-BIONT COVID-19 VAC-TRIS 30 MCG/0.3ML IM SUSP
INTRAMUSCULAR | 0 refills | Status: DC
  Filled 2021-03-17: qty 0.3, 1d supply, fill #0

## 2021-03-17 NOTE — Progress Notes (Signed)
   Covid-19 Vaccination Clinic  Name:  Jillian [name]    MRN: [id number] DOB: 01-23-1941  03/17/2021  [name] was observed post Covid-19 immunization for 15 minutes without incident. She was provided with Vaccine Information Sheet and instruction to access the V-Safe system.   [name] was instructed to call 911 with any severe reactions post vaccine: Difficulty breathing  Swelling of face and throat  A fast heartbeat  A bad rash all over body  Dizziness and weakness   Immunizations Administered     Name Date Dose VIS Date Route   PFIZER Comrnaty(Gray TOP) Covid-19 Vaccine 03/17/2021  1:44 PM 0.3 mL 09/15/2020 Intramuscular   Manufacturer: [employer]   Lot: [id number]   [place]: [phone]

## 2021-03-20 DIAGNOSIS — R911 Solitary pulmonary nodule: Secondary | ICD-10-CM | POA: Diagnosis not present

## 2021-03-20 DIAGNOSIS — K7689 Other specified diseases of liver: Secondary | ICD-10-CM | POA: Diagnosis not present

## 2021-03-20 DIAGNOSIS — C541 Malignant neoplasm of endometrium: Secondary | ICD-10-CM | POA: Diagnosis not present

## 2021-03-20 DIAGNOSIS — I313 Pericardial effusion (noninflammatory): Secondary | ICD-10-CM | POA: Diagnosis not present

## 2021-03-20 DIAGNOSIS — I371 Nonrheumatic pulmonary valve insufficiency: Secondary | ICD-10-CM | POA: Diagnosis not present

## 2021-03-20 DIAGNOSIS — I071 Rheumatic tricuspid insufficiency: Secondary | ICD-10-CM | POA: Diagnosis not present

## 2021-03-20 DIAGNOSIS — Z95828 Presence of other vascular implants and grafts: Secondary | ICD-10-CM | POA: Diagnosis not present

## 2021-03-20 DIAGNOSIS — I513 Intracardiac thrombosis, not elsewhere classified: Secondary | ICD-10-CM | POA: Diagnosis not present

## 2021-03-20 DIAGNOSIS — I340 Nonrheumatic mitral (valve) insufficiency: Secondary | ICD-10-CM | POA: Diagnosis not present

## 2021-03-20 DIAGNOSIS — Z9889 Other specified postprocedural states: Secondary | ICD-10-CM | POA: Diagnosis not present

## 2021-03-20 DIAGNOSIS — N952 Postmenopausal atrophic vaginitis: Secondary | ICD-10-CM | POA: Diagnosis not present

## 2021-03-20 DIAGNOSIS — Z923 Personal history of irradiation: Secondary | ICD-10-CM | POA: Diagnosis not present

## 2021-03-20 DIAGNOSIS — Z9221 Personal history of antineoplastic chemotherapy: Secondary | ICD-10-CM | POA: Diagnosis not present

## 2021-03-24 DIAGNOSIS — M6281 Muscle weakness (generalized): Secondary | ICD-10-CM | POA: Diagnosis not present

## 2021-03-24 DIAGNOSIS — R102 Pelvic and perineal pain: Secondary | ICD-10-CM | POA: Diagnosis not present

## 2021-03-24 DIAGNOSIS — N952 Postmenopausal atrophic vaginitis: Secondary | ICD-10-CM | POA: Diagnosis not present

## 2021-03-24 DIAGNOSIS — Z923 Personal history of irradiation: Secondary | ICD-10-CM | POA: Diagnosis not present

## 2021-03-27 DIAGNOSIS — Z452 Encounter for adjustment and management of vascular access device: Secondary | ICD-10-CM | POA: Diagnosis not present

## 2021-03-31 DIAGNOSIS — Z923 Personal history of irradiation: Secondary | ICD-10-CM | POA: Diagnosis not present

## 2021-03-31 DIAGNOSIS — R102 Pelvic and perineal pain: Secondary | ICD-10-CM | POA: Diagnosis not present

## 2021-03-31 DIAGNOSIS — M6281 Muscle weakness (generalized): Secondary | ICD-10-CM | POA: Diagnosis not present

## 2021-03-31 DIAGNOSIS — N952 Postmenopausal atrophic vaginitis: Secondary | ICD-10-CM | POA: Diagnosis not present

## 2021-04-03 DIAGNOSIS — M47814 Spondylosis without myelopathy or radiculopathy, thoracic region: Secondary | ICD-10-CM | POA: Diagnosis not present

## 2021-04-07 DIAGNOSIS — R102 Pelvic and perineal pain: Secondary | ICD-10-CM | POA: Diagnosis not present

## 2021-04-07 DIAGNOSIS — Z923 Personal history of irradiation: Secondary | ICD-10-CM | POA: Diagnosis not present

## 2021-04-07 DIAGNOSIS — N952 Postmenopausal atrophic vaginitis: Secondary | ICD-10-CM | POA: Diagnosis not present

## 2021-04-07 DIAGNOSIS — M6281 Muscle weakness (generalized): Secondary | ICD-10-CM | POA: Diagnosis not present

## 2021-04-21 DIAGNOSIS — Z923 Personal history of irradiation: Secondary | ICD-10-CM | POA: Diagnosis not present

## 2021-04-21 DIAGNOSIS — N952 Postmenopausal atrophic vaginitis: Secondary | ICD-10-CM | POA: Diagnosis not present

## 2021-04-21 DIAGNOSIS — M6281 Muscle weakness (generalized): Secondary | ICD-10-CM | POA: Diagnosis not present

## 2021-04-21 DIAGNOSIS — R102 Pelvic and perineal pain: Secondary | ICD-10-CM | POA: Diagnosis not present

## 2021-05-12 DIAGNOSIS — R102 Pelvic and perineal pain: Secondary | ICD-10-CM | POA: Diagnosis not present

## 2021-05-12 DIAGNOSIS — N952 Postmenopausal atrophic vaginitis: Secondary | ICD-10-CM | POA: Diagnosis not present

## 2021-05-12 DIAGNOSIS — Z923 Personal history of irradiation: Secondary | ICD-10-CM | POA: Diagnosis not present

## 2021-05-12 DIAGNOSIS — G8929 Other chronic pain: Secondary | ICD-10-CM | POA: Diagnosis not present

## 2021-05-12 DIAGNOSIS — M545 Low back pain, unspecified: Secondary | ICD-10-CM | POA: Diagnosis not present

## 2021-05-12 DIAGNOSIS — M6281 Muscle weakness (generalized): Secondary | ICD-10-CM | POA: Diagnosis not present

## 2021-05-30 ENCOUNTER — Other Ambulatory Visit: Payer: Self-pay

## 2021-05-30 MED ORDER — PANTOPRAZOLE SODIUM 20 MG PO TBEC: 20.0000 mg | DELAYED_RELEASE_TABLET | Freq: Every day | ORAL | 2 refills | Status: DC

## 2021-05-30 NOTE — Progress Notes (Signed)
Refill request for Protonix once daily rec'd via fax. Last seen 10/2020

## 2021-06-02 DIAGNOSIS — G8929 Other chronic pain: Secondary | ICD-10-CM | POA: Diagnosis not present

## 2021-06-02 DIAGNOSIS — R7303 Prediabetes: Secondary | ICD-10-CM | POA: Diagnosis not present

## 2021-06-02 DIAGNOSIS — M546 Pain in thoracic spine: Secondary | ICD-10-CM | POA: Diagnosis not present

## 2021-06-02 DIAGNOSIS — M5459 Other low back pain: Secondary | ICD-10-CM | POA: Diagnosis not present

## 2021-06-02 DIAGNOSIS — D709 Neutropenia, unspecified: Secondary | ICD-10-CM | POA: Diagnosis not present

## 2021-06-05 DIAGNOSIS — R7303 Prediabetes: Secondary | ICD-10-CM | POA: Diagnosis not present

## 2021-06-05 DIAGNOSIS — M546 Pain in thoracic spine: Secondary | ICD-10-CM | POA: Diagnosis not present

## 2021-06-05 DIAGNOSIS — G8929 Other chronic pain: Secondary | ICD-10-CM | POA: Diagnosis not present

## 2021-06-05 DIAGNOSIS — M545 Low back pain, unspecified: Secondary | ICD-10-CM | POA: Diagnosis not present

## 2021-06-05 DIAGNOSIS — D709 Neutropenia, unspecified: Secondary | ICD-10-CM | POA: Diagnosis not present

## 2021-06-13 DIAGNOSIS — I10 Essential (primary) hypertension: Secondary | ICD-10-CM | POA: Diagnosis not present

## 2021-06-13 DIAGNOSIS — Z8619 Personal history of other infectious and parasitic diseases: Secondary | ICD-10-CM | POA: Diagnosis not present

## 2021-06-13 DIAGNOSIS — R300 Dysuria: Secondary | ICD-10-CM | POA: Diagnosis not present

## 2021-06-14 DIAGNOSIS — R3915 Urgency of urination: Secondary | ICD-10-CM | POA: Diagnosis not present

## 2021-07-03 DIAGNOSIS — I10 Essential (primary) hypertension: Secondary | ICD-10-CM | POA: Diagnosis not present

## 2021-07-03 DIAGNOSIS — N898 Other specified noninflammatory disorders of vagina: Secondary | ICD-10-CM | POA: Diagnosis not present

## 2021-07-19 DIAGNOSIS — Z823 Family history of stroke: Secondary | ICD-10-CM | POA: Diagnosis not present

## 2021-07-19 DIAGNOSIS — Z8249 Family history of ischemic heart disease and other diseases of the circulatory system: Secondary | ICD-10-CM | POA: Diagnosis not present

## 2021-07-19 DIAGNOSIS — J309 Allergic rhinitis, unspecified: Secondary | ICD-10-CM | POA: Diagnosis not present

## 2021-07-19 DIAGNOSIS — Z809 Family history of malignant neoplasm, unspecified: Secondary | ICD-10-CM | POA: Diagnosis not present

## 2021-07-19 DIAGNOSIS — K5900 Constipation, unspecified: Secondary | ICD-10-CM | POA: Diagnosis not present

## 2021-07-19 DIAGNOSIS — I10 Essential (primary) hypertension: Secondary | ICD-10-CM | POA: Diagnosis not present

## 2021-07-19 DIAGNOSIS — Z818 Family history of other mental and behavioral disorders: Secondary | ICD-10-CM | POA: Diagnosis not present

## 2021-07-19 DIAGNOSIS — K219 Gastro-esophageal reflux disease without esophagitis: Secondary | ICD-10-CM | POA: Diagnosis not present

## 2021-07-19 DIAGNOSIS — M1990 Unspecified osteoarthritis, unspecified site: Secondary | ICD-10-CM | POA: Diagnosis not present

## 2021-08-08 DIAGNOSIS — M545 Low back pain, unspecified: Secondary | ICD-10-CM | POA: Diagnosis not present

## 2021-08-08 DIAGNOSIS — G8929 Other chronic pain: Secondary | ICD-10-CM | POA: Diagnosis not present

## 2021-08-08 DIAGNOSIS — M546 Pain in thoracic spine: Secondary | ICD-10-CM | POA: Diagnosis not present

## 2021-08-21 DIAGNOSIS — J309 Allergic rhinitis, unspecified: Secondary | ICD-10-CM | POA: Diagnosis not present

## 2021-08-21 DIAGNOSIS — R053 Chronic cough: Secondary | ICD-10-CM | POA: Diagnosis not present

## 2021-09-25 DIAGNOSIS — C541 Malignant neoplasm of endometrium: Secondary | ICD-10-CM | POA: Diagnosis not present

## 2021-09-25 DIAGNOSIS — Z8542 Personal history of malignant neoplasm of other parts of uterus: Secondary | ICD-10-CM | POA: Diagnosis not present

## 2021-09-25 DIAGNOSIS — Z08 Encounter for follow-up examination after completed treatment for malignant neoplasm: Secondary | ICD-10-CM | POA: Diagnosis not present

## 2021-09-26 DIAGNOSIS — G8929 Other chronic pain: Secondary | ICD-10-CM | POA: Diagnosis not present

## 2021-09-26 DIAGNOSIS — M546 Pain in thoracic spine: Secondary | ICD-10-CM | POA: Diagnosis not present

## 2021-09-26 DIAGNOSIS — M545 Low back pain, unspecified: Secondary | ICD-10-CM | POA: Diagnosis not present

## 2021-10-31 DIAGNOSIS — M546 Pain in thoracic spine: Secondary | ICD-10-CM | POA: Diagnosis not present

## 2021-10-31 DIAGNOSIS — M545 Low back pain, unspecified: Secondary | ICD-10-CM | POA: Diagnosis not present

## 2021-10-31 DIAGNOSIS — G8929 Other chronic pain: Secondary | ICD-10-CM | POA: Diagnosis not present

## 2021-11-17 DIAGNOSIS — D72819 Decreased white blood cell count, unspecified: Secondary | ICD-10-CM | POA: Diagnosis not present

## 2021-11-17 DIAGNOSIS — I10 Essential (primary) hypertension: Secondary | ICD-10-CM | POA: Diagnosis not present

## 2021-11-21 DIAGNOSIS — Z0000 Encounter for general adult medical examination without abnormal findings: Secondary | ICD-10-CM | POA: Diagnosis not present

## 2021-11-21 DIAGNOSIS — J309 Allergic rhinitis, unspecified: Secondary | ICD-10-CM | POA: Diagnosis not present

## 2021-11-21 DIAGNOSIS — Z1231 Encounter for screening mammogram for malignant neoplasm of breast: Secondary | ICD-10-CM | POA: Diagnosis not present

## 2021-11-21 DIAGNOSIS — K219 Gastro-esophageal reflux disease without esophagitis: Secondary | ICD-10-CM | POA: Diagnosis not present

## 2021-11-21 DIAGNOSIS — N1831 Chronic kidney disease, stage 3a: Secondary | ICD-10-CM | POA: Diagnosis not present

## 2021-11-21 DIAGNOSIS — R197 Diarrhea, unspecified: Secondary | ICD-10-CM | POA: Diagnosis not present

## 2021-11-21 DIAGNOSIS — D72819 Decreased white blood cell count, unspecified: Secondary | ICD-10-CM | POA: Diagnosis not present

## 2021-11-21 DIAGNOSIS — Z8619 Personal history of other infectious and parasitic diseases: Secondary | ICD-10-CM | POA: Diagnosis not present

## 2021-12-11 ENCOUNTER — Ambulatory Visit: Payer: Medicare PPO | Admitting: Physician Assistant

## 2021-12-11 ENCOUNTER — Other Ambulatory Visit: Payer: Medicare PPO

## 2021-12-11 ENCOUNTER — Encounter: Payer: Self-pay | Admitting: Physician Assistant

## 2021-12-11 VITALS — BP 124/68 | HR 68 | Resp 16 | Ht 64.0 in | Wt 137.4 lb

## 2021-12-11 DIAGNOSIS — Z8619 Personal history of other infectious and parasitic diseases: Secondary | ICD-10-CM | POA: Diagnosis not present

## 2021-12-11 DIAGNOSIS — R197 Diarrhea, unspecified: Secondary | ICD-10-CM

## 2021-12-11 MED ORDER — DICYCLOMINE HCL 10 MG PO CAPS: 10.0000 mg | ORAL_CAPSULE | Freq: Two times a day (BID) | ORAL | 4 refills | Status: DC

## 2021-12-11 NOTE — Progress Notes (Signed)
? ?Subjective:  ? ? Patient ID: Jillian [name], female    DOB: 1941-02-18, 81 y.o.   MRN: [id number] ? ?HPI ? Jillian [name] is a pleasant 81 year old white female, known to Dr. Havery Moros, and myself.  She was last seen here in January 2022 after she had been hospitalized with a partial small bowel obstruction which resolved with conservative management. ?Patient comes back in today with recurrent diarrhea. ?She does have history of pelvic radiation for endometrial cancer in 2021, also with history of relapsing C. difficile 2021. ?She had undergone colonoscopy in March 2021 with finding of for a few small polyps, left and right-sided diverticulosis.  Random biopsies were done and negative for microscopic colitis. ?Patient says that she had onset of recurrent diarrhea in January 2023, prior to that had had an occasional episode but nothing on a regular basis.  Now over the past 6 weeks or so she is having frequent urgent episodes of diarrhea and often not making it to the bathroom.  She is not having any abdominal pain or cramping, no bleeding. ?She did take a course of antibiotics in November for urinary tract infection. ?She says most days she will start out with a formed or semiformed bowel movement followed by progressively looser stool as the day goes on sometimes to the point of being watery.  On a bad day she has gone 7 or 8 times, on good days may have 2 bowel movements.  She did have 1 nocturnal episode within the past couple of weeks. ?She does not feel particularly bad, no fevers or chills.  Appetite okay, has had some mild queasiness, stool not particularly malodorous. ?She says that she had C. difficile testing through her PCP/Dr. Shelia Media and was told this was negative.  I do not have copy of that lab and when we called their office they report that  it was not completed. ? ? ?Review of Systems Pertinent positive and negative review of systems were noted in the above HPI section.  All other review of systems was  otherwise negative.  ? ?Outpatient Encounter Medications as of 12/11/2021  ?Medication Sig  ? diazepam (VALIUM) 2 MG tablet Take by mouth. PRN  ? dicyclomine (BENTYL) 10 MG capsule Take 1 capsule (10 mg total) by mouth in the morning and at bedtime.  ? EPINEPHrine 0.3 mg/0.3 mL IJ SOAJ injection Inject 0.3 mg into the muscle as needed for anaphylaxis.  ? Multiple Vitamins-Minerals (CENTRUM SILVER PO) Take 1 tablet by mouth daily.  ? olmesartan (BENICAR) 40 MG tablet Take 40 mg by mouth See admin instructions. Take every 3 days  ? pantoprazole (PROTONIX) 20 MG tablet Take 1 tablet (20 mg total) by mouth daily.  ? CALCIUM CITRATE PO Take 1 tablet by mouth daily. (Patient not taking: Reported on 12/11/2021)  ? COVID-19 mRNA Vac-TriS, Pfizer, (PFIZER-BIONT COVID-19 VAC-TRIS) SUSP injection Inject into the muscle. (Patient not taking: Reported on 12/11/2021)  ? ipratropium (ATROVENT) 0.03 % nasal spray Place 2 sprays into both nostrils daily as needed.  (Patient not taking: Reported on 12/11/2021)  ? polyethylene glycol (MIRALAX) 17 g packet Take 17 g by mouth daily. (Patient not taking: Reported on 12/11/2021)  ? Pyridoxine HCl (VITAMIN B6) 50 MG TABS Take 1 tablet by mouth daily. (Patient not taking: Reported on 12/11/2021)  ? [DISCONTINUED] dicyclomine (BENTYL) 10 MG capsule Take 1 capsule (10 mg total) by mouth every 8 (eight) hours as needed for spasms.  ? [DISCONTINUED] gabapentin (NEURONTIN) 100 MG capsule Take 1-3  capsules (100-300 mg total) by mouth 3 (three) times daily as needed (nerve pain down leg).  ? [DISCONTINUED] predniSONE (DELTASONE) 10 MG tablet Take 3 tablets (30 mg total) by mouth daily with breakfast. (Patient not taking: No sig reported)  ? ?No facility-administered encounter medications on file as of 12/11/2021.  ? ?Allergies  ?Allergen Reactions  ? Bee Venom Hives  ? ?Patient Active Problem List  ? Diagnosis Date Noted  ? History of infection of intestine due to Clostridium difficile 12/11/2021  ? DISH  (diffuse idiopathic skeletal hyperostosis) 12/27/2020  ? Moderate protein-calorie malnutrition ([place]) 10/03/2020  ? Endometrial cancer ([place]) 01/25/2020  ? Postmenopausal bleeding 03/04/2019  ? ABDOMINAL PAIN, UNSPECIFIED SITE 08/20/2008  ? DUODENITIS 08/17/2008  ? DIVERTICULOSIS, COLON 08/17/2008  ? ABDOMINAL BLOATING 08/17/2008  ? DIARRHEA 08/17/2008  ? GASTRITIS, HX OF 08/17/2008  ? ?Social History  ? ?Socioeconomic History  ? Marital status: Married  ?  Spouse name: Not on file  ? Number of children: Not on file  ? Years of education: Not on file  ? Highest education level: Not on file  ?Occupational History  ? Occupation: Retired  ?Tobacco Use  ? Smoking status: Former  ?  Types: Cigarettes  ?  Quit date: 10/08/1990  ?  Years since quitting: 31.1  ? Smokeless tobacco: Never  ?Vaping Use  ? Vaping Use: Never used  ?Substance and Sexual Activity  ? Alcohol use: Not Currently  ? Drug use: No  ? Sexual activity: Not Currently  ?  Birth control/protection: Surgical  ?Other Topics Concern  ? Not on file  ?Social History Narrative  ? Not on file  ? ?Social Determinants of Health  ? ?Financial Resource Strain: Not on file  ?Food Insecurity: Not on file  ?Transportation Needs: Not on file  ?Physical Activity: Not on file  ?Stress: Not on file  ?Social Connections: Not on file  ?Intimate Partner Violence: Not on file  ? ? ?Jillian [name]'s family history includes Alzheimer's disease in her father and mother; Breast cancer in her maternal grandmother; Colon cancer in her maternal uncle; Colon cancer (age of onset: 16) in her mother; Endometrial cancer in her mother; Heart disease in her mother; Ovarian cancer in her maternal grandmother. ? ? ?   ?Objective:  ?  ?Vitals:  ? 12/11/21 1113  ?BP: 124/68  ?Pulse: 68  ?Resp: 16  ?SpO2: 99%  ? ? ?Physical Exam Well-developed well-nourished elderly WF  in no acute distress.  Height, Weight,137  BMI 23.58 ? ?HEENT; nontraumatic normocephalic, EOMI, PE R LA, sclera  anicteric. ?Oropharynx;not examined ?Neck; supple, no JVD ?Cardiovascular; regular rate and rhythm with S1-S2, no murmur rub or gallop ?Pulmonary; Clear bilaterally ?Abdomen; soft, nontender, nondistended, no palpable mass or hepatosplenomegaly, bowel sounds are hyperactive ?Rectal; external tag non inflamed ?Skin; benign exam, no jaundice rash or appreciable lesions ?Extremities; no clubbing cyanosis or edema skin warm and dry ?Neuro/Psych; alert and oriented x4, grossly nonfocal mood and affect appropriate  ? ? ? ?   ?Assessment & Plan:  ? ?#34 81 year old white female with recurrent diarrhea x6 to 8 weeks, no associated abdominal pain or cramping, no fever or chills, has had some intermittent queasiness. ?Generally has a formed to semiformed stool in the morning followed by progressively loose to watery stool, no bleeding.  Patient has had as many as 8-9 bowel movements in a day, frequently very urgent with episodes of incontinence. ? ?Prior history of relapsing C. difficile colitis 2021 ?Prior history of partial  small bowel obstruction 2022 ?History of endometrial cancer status post resection and chemotherapy 2020, followed by radiation completed January 2021 ? ?Colonoscopy March 2021 with random biopsies negative for microscopic colitis ? ?Etiology of diarrhea not clear, rule out all infectious causes,, patient may have some radiation induced colopathy or enteropathy but doubt as etiology as she has not had ongoing symptoms, rule out IBS, consider SIBO ? ?#2 diverticulosis ?#3.  History of adenomatous colon polyps-up-to-date with colonoscopy last done March 2021 ?#4 family history of colon cancer-mother ? ?Plan GI path profile, C. difficile PCR, lactoferrin, fecal elastase ?We discussed a bland diet ?Start Bentyl 10 mg p.o. every morning, may take second dose each afternoon as needed ? ?Further recommendations pending results of stool studies, if stool studies are all negative and symptoms are persisting would  proceed with breath testing for SIBO. ? ?Alfredia Ferguson PA-C ?12/11/2021 ? ? ?Cc: Deland Pretty, MD ?  ?

## 2021-12-11 NOTE — Patient Instructions (Signed)
If you are age 82 or older, your body mass index should be between 23-30. Your Body mass index is 23.58 kg/m?Marland Kitchen If this is out of the aforementioned range listed, please consider follow up with your Primary Care Provider. ?________________________________________________________ ? ?The [facility] providers would like to encourage you to use [facility] to communicate with providers for non-urgent requests or questions.  Due to long hold times on the telephone, sending your provider a message by [facility] may be a faster and more efficient way to get a response.  Please allow 48 business hours for a response.  Please remember that this is for non-urgent requests.  ?_______________________________________________________ ? ?Your provider has requested that you go to the basement level for lab work before leaving today. Press "B" on the elevator. The lab is located at the first door on the left as you exit the elevator. ? ?START Dicyclomine 10 mg 1 capsule twice daily for urgency, diarrhea ? ?Try Balmex cream (over the counter) for anal skin irritation. ? ?Follow a bland diet. ?Avoid Lactose ?Avoid artificial sweeteners. ? ?Follow up pending. ? ?Thank you for entrusting me with your care and choosing [facility]. ? ?Nicoletta Ba, PA-C ?

## 2021-12-12 NOTE — Progress Notes (Signed)
Agree with assessment and plan as outlined.  

## 2021-12-15 ENCOUNTER — Other Ambulatory Visit: Payer: Medicare PPO

## 2021-12-15 DIAGNOSIS — R197 Diarrhea, unspecified: Secondary | ICD-10-CM

## 2021-12-17 LAB — CLOSTRIDIUM DIFFICILE BY PCR: Toxigenic C. Difficile by PCR: NEGATIVE

## 2021-12-19 LAB — GI PROFILE, STOOL, PCR

## 2021-12-21 ENCOUNTER — Telehealth: Payer: Self-pay

## 2021-12-21 NOTE — Telephone Encounter (Signed)
-----   Message from Alfredia Ferguson, PA-C sent at 12/20/2021  3:32 PM EDT ----- ?Please call patient and let her know all of the stool studies have resulted and are all negative. ?Hopefully she is doing better with use of Bentyl 10 mg every morning.  Please let me know if she is still having significant problems with the diarrhea, we discussed potential breath testing for SIBO and as you know these test are on backorder ?

## 2021-12-21 NOTE — Telephone Encounter (Signed)
Left message for patient to please call back. 

## 2021-12-22 LAB — PANCREATIC ELASTASE, FECAL: Pancreatic Elastase-1, Stool: >500 mcg/g

## 2021-12-22 LAB — FECAL LACTOFERRIN, QUANT
Fecal Lactoferrin: NEGATIVE
MICRO NUMBER:: 13115331
SPECIMEN QUALITY:: ADEQUATE

## 2021-12-25 NOTE — Telephone Encounter (Signed)
3/20 line busy .plp ?

## 2021-12-25 NOTE — Telephone Encounter (Signed)
Patient returned call

## 2021-12-25 NOTE — Telephone Encounter (Signed)
Tried to reach pt by phone and line is busy will attempt later  ?

## 2021-12-26 NOTE — Telephone Encounter (Signed)
Line busy

## 2021-12-28 NOTE — Telephone Encounter (Signed)
Patient contacted. Reports she is doing better. ?

## 2022-01-29 DIAGNOSIS — M47894 Other spondylosis, thoracic region: Secondary | ICD-10-CM | POA: Diagnosis not present

## 2022-01-29 DIAGNOSIS — M546 Pain in thoracic spine: Secondary | ICD-10-CM | POA: Diagnosis not present

## 2022-02-26 ENCOUNTER — Other Ambulatory Visit: Payer: Medicare PPO

## 2022-02-26 ENCOUNTER — Encounter: Payer: Self-pay | Admitting: Gastroenterology

## 2022-02-26 ENCOUNTER — Ambulatory Visit: Payer: Medicare PPO | Admitting: Gastroenterology

## 2022-02-26 VITALS — BP 110/60 | HR 70 | Ht 64.0 in | Wt 134.0 lb

## 2022-02-26 DIAGNOSIS — K219 Gastro-esophageal reflux disease without esophagitis: Secondary | ICD-10-CM

## 2022-02-26 DIAGNOSIS — R197 Diarrhea, unspecified: Secondary | ICD-10-CM

## 2022-02-26 DIAGNOSIS — R194 Change in bowel habit: Secondary | ICD-10-CM

## 2022-02-26 MED ORDER — PANTOPRAZOLE SODIUM 20 MG PO TBEC: 20.0000 mg | DELAYED_RELEASE_TABLET | Freq: Every day | ORAL | 1 refills | Status: DC | PRN

## 2022-02-26 MED ORDER — COLESTIPOL HCL 1 G PO TABS
1.0000 g | ORAL_TABLET | Freq: Two times a day (BID) | ORAL | 0 refills | Status: DC
Start: 2022-02-26 — End: 2022-03-23

## 2022-02-26 NOTE — Patient Instructions (Addendum)
If you are age 82 or older, your body mass index should be between 23-30. Your Body mass index is 23 kg/m. If this is out of the aforementioned range listed, please consider follow up with your Primary Care Provider.  If you are age 11 or younger, your body mass index should be between 19-25. Your Body mass index is 23 kg/m. If this is out of the aformentioned range listed, please consider follow up with your Primary Care Provider.   ________________________________________________________  The [place] [facility] providers would like to encourage you to use [facility] to communicate with providers for non-urgent requests or questions.  Due to long hold times on the telephone, sending your provider a message by [facility] may be a faster and more efficient way to get a response.  Please allow 48 business hours for a response.  Please remember that this is for non-urgent requests.  _______________________________________________________   Please go to the lab in the basement of our building to have lab work done as you leave today. Hit "B" for basement when you get on the elevator.  When the doors open the lab is on your left.  We will call you with the results. Thank you.  We have sent the following medications to your pharmacy for you to pick up at your convenience: Colestid 1 g: Take twice a day Protonix: Take once daily as needed  You can take 1/2 tablet of Imodium as needed  Thank you for entrusting me with your care and for choosing [employer], Dr. [place] Cellar

## 2022-02-26 NOTE — Progress Notes (Signed)
HPI :  81 year old female here for follow-up visit for diarrhea.  Recall she has a history of small bowel obstruction, history of C. difficile, history of pelvic radiation for endometrial cancer in 2021.  Her last colonoscopy with me in March 2021 showed a few small polyps, diverticulosis, biopsies negative for microscopic colitis.  She has had some recurrent diarrhea for the past 6 months now that been bothering her.  She was seen by Nicoletta Ba in March and had negative stool testing as outlined below to include normal fecal pancreatic elastase, fecal lactoferrin, C. difficile, GI pathogen panel.  She states she will have 1 bowel movement in the morning when she wakes up that is formed.  She states her main issue is having postprandial urgent loose stools the rest of the day.  Usually after she eats she will have a bowel movement within 30 to 60 minutes and then she is fine until she eats again.  If she does not eat she tends not to have any diarrhea that bothers her.  This is been particularly bothersome for her when she eats out at a restaurant with her family, has a hard time finding a bathroom on the way home.  Denies any blood in her stools.  She has kept a very clear food diary and cannot really pinpoint any particular trigger that causes this routinely.  Some days she has no symptoms at all that bother her, some days she has significant symptoms.  She generally will have loose stools about 3 to 4 days/week after she eats.  She has been on Protonix for some time for reflux.  Recall she has failed Pepcid, caused a racing heart rate.  She takes Protonix 20 mg once every 3 to 4 days or so at baseline which controls her symptoms.  She had an EGD remotely.  She has been on Benicar for several years, states it for hypertension.  She questions if she could have radiation enteritis from her history of pelvic radiation.  She has taken Imodium in the past, states after 1 pill of Imodium she did not have  a bowel movement for few days and felt quite constipated.  She tried taking Bentyl after her last clinic visit which has not really helped her at all either.  She still has a gallbladder in place.  She is scheduled for CT chest abdomen pelvis in June by her oncologist for surveillance.   Fecal lactoferrin negative Pancreatic fecal elastase > 500 GI pathogen panel negative C diff PCR negative  Last CBC in February showed a normal hemoglobin.   Past Medical History:  Diagnosis Date   Allergy    Arthritis    Cataract    Surgery 2014   Endometrial cancer ([place]) 02/2019   GERD (gastroesophageal reflux disease)    Heart murmur    per pt   Hypertension    Osteoporosis    Osteopenia   Small bowel obstruction [facility])      Past Surgical History:  Procedure Laterality Date   BIOPSY ENDOMETRIAL  02/2019   endometrium carcinoma   BREAST ENHANCEMENT SURGERY Bilateral 2006   and face lift   CATARACT EXTRACTION Bilateral 10/2012, 01/2013   COLONOSCOPY     02/10/2014   KNEE ARTHROSCOPY WITH EXCISION BAKER'S CYST Left 1990   LAPAROSCOPIC HYSTERECTOMY     TUBAL LIGATION  1977   UPPER GASTROINTESTINAL ENDOSCOPY  1990s   WISDOM TOOTH EXTRACTION  1990   Family History  Problem Relation Age of  Onset   Alzheimer's disease Mother    Colon cancer Mother 90   Endometrial cancer Mother    Heart disease Mother    Alzheimer's disease Father    Ovarian cancer Maternal Grandmother    Breast cancer Maternal Grandmother    Colon cancer Maternal Uncle    Esophageal cancer Neg Hx    Stomach cancer Neg Hx    Prostate cancer Neg Hx    Rectal cancer Neg Hx    Social History   Tobacco Use   Smoking status: Former    Types: Cigarettes    Quit date: 10/08/1990    Years since quitting: 31.4   Smokeless tobacco: Never  Vaping Use   Vaping Use: Never used  Substance Use Topics   Alcohol use: Not Currently   Drug use: No   Current Outpatient Medications  Medication Sig Dispense Refill   CALCIUM  CITRATE PO Take 1 tablet by mouth daily.     COVID-19 mRNA Vac-TriS, Pfizer, (PFIZER-BIONT COVID-19 VAC-TRIS) SUSP injection Inject into the muscle. 0.3 mL 0   diazepam (VALIUM) 2 MG tablet Take by mouth. PRN     EPINEPHrine 0.3 mg/0.3 mL IJ SOAJ injection Inject 0.3 mg into the muscle as needed for anaphylaxis.     ipratropium (ATROVENT) 0.03 % nasal spray Place 2 sprays into both nostrils daily as needed.     Multiple Vitamins-Minerals (CENTRUM SILVER PO) Take 1 tablet by mouth daily.     olmesartan (BENICAR) 20 MG tablet Take 1 tablet by mouth daily.     olmesartan (BENICAR) 40 MG tablet Take 40 mg by mouth See admin instructions. Take every 3 days     pantoprazole (PROTONIX) 20 MG tablet Take 1 tablet (20 mg total) by mouth daily. 30 tablet 2   polyethylene glycol (MIRALAX) 17 g packet Take 17 g by mouth daily. 14 each 0   Pyridoxine HCl (VITAMIN B6) 50 MG TABS Take 1 tablet by mouth daily.     No current facility-administered medications for this visit.   Allergies  Allergen Reactions   Bee Venom Hives     Review of Systems: All systems reviewed and negative except where noted in HPI.    Lab Results  Component Value Date   WBC 7.8 05/24/2020   HGB 11.7 (L) 05/24/2020   HCT 34.6 (L) 05/24/2020   MCV 96.9 05/24/2020   PLT 171 05/24/2020    Lab Results  Component Value Date   CREATININE 1.21 (H) 05/24/2020   BUN 21 05/24/2020   NA 139 05/24/2020   K 3.4 (L) 05/24/2020   CL 108 05/24/2020   CO2 22 05/24/2020    Lab Results  Component Value Date   ALT 23 05/24/2020   AST 17 05/24/2020   ALKPHOS 46 05/24/2020   BILITOT 0.8 05/24/2020     Physical Exam: BP 110/60   Pulse 70   Ht '5\' 4"'$  (1.626 m)   Wt 134 lb (60.8 kg)   BMI 23.00 kg/m  Constitutional: Pleasant,well-developed, female in no acute distress. Neurological: Alert and oriented to person place and time. Psychiatric: Normal mood and affect. Behavior is normal.   ASSESSMENT AND PLAN: 81 year old  female here for reassessment the following:  Diarrhea Altered bowel habits GERD  As above, ongoing intermittent loose stools that are generally in a postprandial setting.  This has persisted for several months now, stool testing as above has been unremarkable.  Dietary changes have not really helped.  Imodium did work and cause constipation  but she does not like taking that.  Discussed DDx with her.  She is scheduled for CT scan next month for surveillance of her malignancy.  This will evaluate for any inflammation in her bowel, she is at risk for radiation enteritis.  She will proceed with that exam.  Otherwise she has been on Benicar for some time, can cause a sprue-like diarrheal state, albeit rare.  We will screen her for celiac disease with labs.  We may need to try taking her off Benicar over time if symptoms persist without clear cause otherwise.  We discussed ways to medically manage this to provide relief in the interim.  We will try her empirically on colestipol 1 g twice daily for a few week trial and see if that helps given postprandial nature of this.  Otherwise, she can try low-dose Imodium such as half tab every morning and titrate up as needed, this definitely has worked for her in the past in fact has caused constipation.  I recommend she take half tab of Imodium prior to leaving the house when eating meal in a restaurant.  Hopefully this helps her, she will contact me in a few weeks if no improvement.  We will await her CT scan findings.  Over time if symptoms persist despite trial of medications and CT okay, could consider an EGD to evaluate her small intestine if she continues to need to take Benicar or she can talk with her primary care physician about holding the Benicar for some time to see if that helps.  She is in need of a refill for her Protonix.  She did not tolerate H2 blocker.  Using a minimal amount of Protonix, 1 to 2 tablets/week to control symptoms.  She has tolerated  this for years.  Plan: - trial of empiric Colestid 1gm BID - 1 month trial - lab for TTG IgA, IgA - can use Immodium 1/2 tab q day PRN and titrate up as needed - CT CAP in June for surveillance of malignancy, will assess for radiation enteritis - consider holding benicar or EGD if symptoms persist / worsen - refill Protonix '20mg'$  every 3 days PRN, she is intolerant of H2 blockers - contact me in a few weeks if no better  Jolly Mango, MD [facility]

## 2022-02-27 LAB — TISSUE TRANSGLUTAMINASE, IGA: (tTG) Ab, IgA: <1 U/mL

## 2022-02-27 LAB — IGA: Immunoglobulin A: 257 mg/dL (ref 70–320)

## 2022-03-01 DIAGNOSIS — M546 Pain in thoracic spine: Secondary | ICD-10-CM | POA: Diagnosis not present

## 2022-03-14 DIAGNOSIS — M546 Pain in thoracic spine: Secondary | ICD-10-CM | POA: Diagnosis not present

## 2022-03-21 DIAGNOSIS — M546 Pain in thoracic spine: Secondary | ICD-10-CM | POA: Diagnosis not present

## 2022-03-22 DIAGNOSIS — Z90722 Acquired absence of ovaries, bilateral: Secondary | ICD-10-CM | POA: Diagnosis not present

## 2022-03-22 DIAGNOSIS — Z90710 Acquired absence of both cervix and uterus: Secondary | ICD-10-CM | POA: Diagnosis not present

## 2022-03-22 DIAGNOSIS — R918 Other nonspecific abnormal finding of lung field: Secondary | ICD-10-CM | POA: Diagnosis not present

## 2022-03-22 DIAGNOSIS — Z9079 Acquired absence of other genital organ(s): Secondary | ICD-10-CM | POA: Diagnosis not present

## 2022-03-22 DIAGNOSIS — R911 Solitary pulmonary nodule: Secondary | ICD-10-CM | POA: Diagnosis not present

## 2022-03-22 DIAGNOSIS — C541 Malignant neoplasm of endometrium: Secondary | ICD-10-CM | POA: Diagnosis not present

## 2022-03-23 ENCOUNTER — Encounter: Payer: Self-pay | Admitting: Gastroenterology

## 2022-03-23 ENCOUNTER — Other Ambulatory Visit: Payer: Self-pay

## 2022-03-23 MED ORDER — COLESTIPOL HCL 1 G PO TABS: ORAL_TABLET | ORAL | 3 refills | Status: DC

## 2022-03-26 DIAGNOSIS — Z8542 Personal history of malignant neoplasm of other parts of uterus: Secondary | ICD-10-CM | POA: Diagnosis not present

## 2022-03-26 DIAGNOSIS — Z90710 Acquired absence of both cervix and uterus: Secondary | ICD-10-CM | POA: Diagnosis not present

## 2022-03-26 DIAGNOSIS — Z90722 Acquired absence of ovaries, bilateral: Secondary | ICD-10-CM | POA: Diagnosis not present

## 2022-03-26 DIAGNOSIS — C541 Malignant neoplasm of endometrium: Secondary | ICD-10-CM | POA: Diagnosis not present

## 2022-03-26 DIAGNOSIS — Z08 Encounter for follow-up examination after completed treatment for malignant neoplasm: Secondary | ICD-10-CM | POA: Diagnosis not present

## 2022-03-27 DIAGNOSIS — M546 Pain in thoracic spine: Secondary | ICD-10-CM | POA: Diagnosis not present

## 2022-04-02 DIAGNOSIS — M546 Pain in thoracic spine: Secondary | ICD-10-CM | POA: Diagnosis not present

## 2022-04-09 DIAGNOSIS — M546 Pain in thoracic spine: Secondary | ICD-10-CM | POA: Diagnosis not present

## 2022-04-23 DIAGNOSIS — M546 Pain in thoracic spine: Secondary | ICD-10-CM | POA: Diagnosis not present

## 2022-05-01 DIAGNOSIS — M546 Pain in thoracic spine: Secondary | ICD-10-CM | POA: Diagnosis not present

## 2022-05-02 DIAGNOSIS — M4810 Ankylosing hyperostosis [Forestier], site unspecified: Secondary | ICD-10-CM | POA: Diagnosis not present

## 2022-05-02 DIAGNOSIS — R002 Palpitations: Secondary | ICD-10-CM | POA: Diagnosis not present

## 2022-05-02 DIAGNOSIS — N1831 Chronic kidney disease, stage 3a: Secondary | ICD-10-CM | POA: Diagnosis not present

## 2022-05-02 DIAGNOSIS — I498 Other specified cardiac arrhythmias: Secondary | ICD-10-CM | POA: Diagnosis not present

## 2022-05-16 DIAGNOSIS — R002 Palpitations: Secondary | ICD-10-CM | POA: Diagnosis not present

## 2022-05-16 DIAGNOSIS — Z8679 Personal history of other diseases of the circulatory system: Secondary | ICD-10-CM | POA: Diagnosis not present

## 2022-05-21 DIAGNOSIS — R002 Palpitations: Secondary | ICD-10-CM | POA: Diagnosis not present

## 2022-06-05 NOTE — Progress Notes (Unsigned)
Primary Physician/Referring:  Deland Pretty, MD  Patient ID: Jillian [name], female    DOB: 11/10/1940, 81 y.o.   MRN: [id number]  No chief complaint on file.  HPI:    Jillian [name]  is a 81 y.o. ***  Past Medical History:  Diagnosis Date   Allergy    Arthritis    Cataract    Surgery 2014   Endometrial cancer ([place]) 02/2019   GERD (gastroesophageal reflux disease)    Heart murmur    per pt   Hypertension    Osteoporosis    Osteopenia   Small bowel obstruction [facility])    Past Surgical History:  Procedure Laterality Date   BIOPSY ENDOMETRIAL  02/2019   endometrium carcinoma   BREAST ENHANCEMENT SURGERY Bilateral 2006   and face lift   CATARACT EXTRACTION Bilateral 10/2012, 01/2013   COLONOSCOPY     02/10/2014   KNEE ARTHROSCOPY WITH EXCISION BAKER'S CYST Left 1990   LAPAROSCOPIC HYSTERECTOMY     TUBAL LIGATION  1977   UPPER GASTROINTESTINAL ENDOSCOPY  1990s   WISDOM TOOTH EXTRACTION  1990   Family History  Problem Relation Age of Onset   Alzheimer's disease Mother    Colon cancer Mother 45   Endometrial cancer Mother    Heart disease Mother    Alzheimer's disease Father    Ovarian cancer Maternal Grandmother    Breast cancer Maternal Grandmother    Colon cancer Maternal Uncle    Esophageal cancer Neg Hx    Stomach cancer Neg Hx    Prostate cancer Neg Hx    Rectal cancer Neg Hx     Social History   Tobacco Use   Smoking status: Former    Types: Cigarettes    Quit date: 10/08/1990    Years since quitting: 31.6   Smokeless tobacco: Never  Substance Use Topics   Alcohol use: Not Currently   Marital Status: Married  ROS  ***ROS Objective      02/26/2022    1:32 PM 12/11/2021   11:13 AM 01/17/2021   10:35 AM  Vitals with BMI  Height 5' 4"  5' 4"  5' 4"   Weight 134 lbs 137 lbs 6 oz 135 lbs 3 oz  BMI 22.99 78.58 85.0  Systolic 277 412 90  Diastolic 60 68 54  Pulse 70 68 68   There were no vitals filed for this visit. There is no height or weight on file to  calculate BMI.   ***Physical Exam  Medications and allergies   Allergies  Allergen Reactions   Bee Venom Hives     Medication list after today's encounter   Current Outpatient Medications:    CALCIUM CITRATE PO, Take 1 tablet by mouth daily., Disp: , Rfl:    colestipol (COLESTID) 1 g tablet, Take 1 tablet (1 g total) by mouth once to twice daily as needed., Disp: 90 tablet, Rfl: 3   COVID-19 mRNA Vac-TriS, Pfizer, (PFIZER-BIONT COVID-19 VAC-TRIS) SUSP injection, Inject into the muscle., Disp: 0.3 mL, Rfl: 0   diazepam (VALIUM) 2 MG tablet, Take by mouth. PRN, Disp: , Rfl:    EPINEPHrine 0.3 mg/0.3 mL IJ SOAJ injection, Inject 0.3 mg into the muscle as needed for anaphylaxis., Disp: , Rfl:    ipratropium (ATROVENT) 0.03 % nasal spray, Place 2 sprays into both nostrils daily as needed., Disp: , Rfl:    loperamide (IMODIUM) 2 MG capsule, Take 1 mg by mouth as needed for diarrhea or loose stools., Disp: , Rfl:  Multiple Vitamins-Minerals (CENTRUM SILVER PO), Take 1 tablet by mouth daily., Disp: , Rfl:    olmesartan (BENICAR) 20 MG tablet, Take 1 tablet by mouth daily., Disp: , Rfl:    olmesartan (BENICAR) 40 MG tablet, Take 40 mg by mouth See admin instructions. Take every 3 days, Disp: , Rfl:    pantoprazole (PROTONIX) 20 MG tablet, Take 1 tablet (20 mg total) by mouth daily as needed., Disp: 90 tablet, Rfl: 1   polyethylene glycol (MIRALAX) 17 g packet, Take 17 g by mouth daily., Disp: 14 each, Rfl: 0   Pyridoxine HCl (VITAMIN B6) 50 MG TABS, Take 1 tablet by mouth daily., Disp: , Rfl:   Laboratory examination:   External labs:   Labs 11/17/2021:  Total cholesterol 240, triglycerides 118, HDL 52, LDL 164.  Hb 13.0/HCT 38.5, platelets 184, normal indicis.  BUN 25, creatinine 1.13, EGFR 45 mL, potassium 4.7, LFTs normal.  Radiology:    Cardiac Studies:  Coronary calcium score 21-Dec-2020: Total Agatston score 0.  Ascending and descending thoracic aortic measurements were  normal.   4 mm noncalcified nodule in the right posterolateral lower lobe.  No other significant extracardiac abnormality.  Extended EKG monitoring 6 days starting 05/02/2022: Predominant rhythm is normal sinus rhythm.  Minimum heart rate 43, maximum heart rate 164 bpm.  2.  Brief atrial tachycardia episodes, longest 6 beats.  Isolated PACs and PVCs were noted.  There were no ventricular couplets or triplets.  Symptomatic transmission correlated with ventricular bigeminy and trigeminy and occasional ventricular ectopics.  EKG:   ***  ***  Assessment  No diagnosis found.   No orders of the defined types were placed in this encounter.   No orders of the defined types were placed in this encounter.   There are no discontinued medications.   Recommendations:   Jillian [name] is a 81 y.o.  with hypertension, hyperlipidemia, chronic stage IIIa kidney disease, referred to me for evaluation of palpitations.***    Adrian Prows, MD, [facility] 06/05/2022, 7:08 PM Office: [phone]

## 2022-06-06 ENCOUNTER — Encounter: Payer: Self-pay | Admitting: Cardiology

## 2022-06-06 ENCOUNTER — Ambulatory Visit: Payer: Medicare PPO | Admitting: Cardiology

## 2022-06-06 VITALS — BP 131/76 | HR 57 | Temp 97.2°F | Resp 16 | Ht 64.0 in | Wt 133.0 lb

## 2022-06-06 DIAGNOSIS — I493 Ventricular premature depolarization: Secondary | ICD-10-CM

## 2022-06-06 DIAGNOSIS — E7800 Pure hypercholesterolemia, unspecified: Secondary | ICD-10-CM | POA: Diagnosis not present

## 2022-06-06 DIAGNOSIS — R002 Palpitations: Secondary | ICD-10-CM

## 2022-06-06 DIAGNOSIS — N950 Postmenopausal bleeding: Secondary | ICD-10-CM | POA: Diagnosis not present

## 2022-07-18 ENCOUNTER — Ambulatory Visit: Payer: Medicare PPO | Admitting: Podiatry

## 2022-07-18 DIAGNOSIS — B351 Tinea unguium: Secondary | ICD-10-CM | POA: Diagnosis not present

## 2022-07-18 NOTE — Progress Notes (Signed)
   Chief Complaint  Patient presents with   Toe Pain    Patient came in today right hallux nail pain, start 4 months nail split, patient is unable to wear closed in shoes without pain    Subjective: 81 y.o. female presenting today as a new patient for evaluation of discoloration with partial detachment of the bilateral toenails right worse than the left.  Patient states over the last several months she has noticed her toenail detached from the underlying nailbed.  She denies a history of injury.  She presents for further treatment and evaluation  Past Medical History:  Diagnosis Date   Allergy    Arthritis    Cataract    Surgery 2014   Endometrial cancer ([place]) 02/2019   GERD (gastroesophageal reflux disease)    Heart murmur    per pt   Hypertension    Osteoporosis    Osteopenia   Small bowel obstruction [facility])     Past Surgical History:  Procedure Laterality Date   BIOPSY ENDOMETRIAL  02/2019   endometrium carcinoma   BREAST ENHANCEMENT SURGERY Bilateral 2006   and face lift   CATARACT EXTRACTION Bilateral 10/2012, 01/2013   COLONOSCOPY     02/10/2014   KNEE ARTHROSCOPY WITH EXCISION BAKER'S CYST Left 1990   LAPAROSCOPIC HYSTERECTOMY     TUBAL LIGATION  1977   UPPER GASTROINTESTINAL ENDOSCOPY  1990s   WISDOM TOOTH EXTRACTION  1990    Allergies  Allergen Reactions   Bee Venom Hives    Objective: Physical Exam General: The patient is alert and oriented x3 in no acute distress.  Dermatology: Hyperkeratotic, discolored, thickened, onychodystrophy noted to the bilateral great toenails. Skin is warm, dry and supple bilateral lower extremities. Negative for open lesions or macerations.  Neurovascular status intact  Musculoskeletal Exam: No pedal deformity noted  Assessment: #1 Onychomycosis of toenails  Plan of Care:  #1 Patient was evaluated. #2  Mechanical debridement of the bilateral toenails was performed today using a nail nipper without incident or bleeding #3  recommend OTC Tolcylen antifungal topical that was dispensed at checkout apply daily #4 recommend good supportive shoes that do not constrict the toebox area #5 return to clinic as needed   Edrick Kins, DPM [facility]  Dr. Edrick Kins, DPM    [address], [place] [address]                Office [phone]  Fax [phone]

## 2022-08-10 ENCOUNTER — Telehealth: Payer: Self-pay

## 2022-08-10 DIAGNOSIS — R002 Palpitations: Secondary | ICD-10-CM

## 2022-08-10 NOTE — Telephone Encounter (Signed)
Patient still having issues with her pvc's and a little dizziness and fingers tingling, she asked if she should be seen or you can prescribe something.

## 2022-08-12 MED ORDER — ATENOLOL 25 MG PO TABS: 25.0000 mg | ORAL_TABLET | Freq: Every day | ORAL | 2 refills | Status: DC

## 2022-08-12 NOTE — Telephone Encounter (Signed)
ICD-10-CM   1. Palpitations  R00.2 atenolol (TENORMIN) 25 MG tablet     No orders of the defined types were placed in this encounter.   Meds ordered this encounter  Medications   atenolol (TENORMIN) 25 MG tablet    Sig: Take 1 tablet (25 mg total) by mouth daily.    Dispense:  30 tablet    Refill:  2    Refills to Dr. Deland Pretty

## 2022-09-05 ENCOUNTER — Encounter (HOSPITAL_COMMUNITY): Payer: Self-pay | Admitting: Internal Medicine

## 2022-09-05 ENCOUNTER — Ambulatory Visit (HOSPITAL_BASED_OUTPATIENT_CLINIC_OR_DEPARTMENT_OTHER)
Admission: RE | Admit: 2022-09-05 | Discharge: 2022-09-05 | Disposition: A | Discharge status: Home / Self Care | Payer: Medicare PPO | Source: Ambulatory Visit | Attending: Internal Medicine | Admitting: Internal Medicine

## 2022-09-05 ENCOUNTER — Other Ambulatory Visit (HOSPITAL_COMMUNITY): Payer: Self-pay | Admitting: Internal Medicine

## 2022-09-05 DIAGNOSIS — Z880 Allergy status to penicillin: Secondary | ICD-10-CM | POA: Diagnosis not present

## 2022-09-05 DIAGNOSIS — K5792 Diverticulitis of intestine, part unspecified, without perforation or abscess without bleeding: Secondary | ICD-10-CM | POA: Diagnosis not present

## 2022-09-05 DIAGNOSIS — R1032 Left lower quadrant pain: Secondary | ICD-10-CM | POA: Insufficient documentation

## 2022-09-05 DIAGNOSIS — N134 Hydroureter: Secondary | ICD-10-CM | POA: Diagnosis not present

## 2022-09-05 LAB — POCT I-STAT CREATININE: Creatinine, Ser: 1 mg/dL (ref 0.44–1.00)

## 2022-09-05 MED ORDER — IOHEXOL 300 MG/ML  SOLN
100.0000 mL | Freq: Once | INTRAMUSCULAR | Status: AC | PRN
  Administered 2022-09-05: 100 mL via INTRAVENOUS

## 2022-09-18 DIAGNOSIS — N3941 Urge incontinence: Secondary | ICD-10-CM | POA: Diagnosis not present

## 2022-09-18 DIAGNOSIS — N134 Hydroureter: Secondary | ICD-10-CM | POA: Diagnosis not present

## 2022-09-18 DIAGNOSIS — R351 Nocturia: Secondary | ICD-10-CM | POA: Diagnosis not present

## 2022-09-18 DIAGNOSIS — N200 Calculus of kidney: Secondary | ICD-10-CM | POA: Diagnosis not present

## 2022-09-18 DIAGNOSIS — N201 Calculus of ureter: Secondary | ICD-10-CM | POA: Diagnosis not present

## 2022-09-24 DIAGNOSIS — R911 Solitary pulmonary nodule: Secondary | ICD-10-CM | POA: Diagnosis not present

## 2022-09-24 DIAGNOSIS — R918 Other nonspecific abnormal finding of lung field: Secondary | ICD-10-CM | POA: Diagnosis not present

## 2022-10-03 DIAGNOSIS — Z08 Encounter for follow-up examination after completed treatment for malignant neoplasm: Secondary | ICD-10-CM | POA: Diagnosis not present

## 2022-10-03 DIAGNOSIS — Z923 Personal history of irradiation: Secondary | ICD-10-CM | POA: Diagnosis not present

## 2022-10-03 DIAGNOSIS — Z8542 Personal history of malignant neoplasm of other parts of uterus: Secondary | ICD-10-CM | POA: Diagnosis not present

## 2022-10-03 DIAGNOSIS — C541 Malignant neoplasm of endometrium: Secondary | ICD-10-CM | POA: Diagnosis not present

## 2022-10-03 DIAGNOSIS — Z9221 Personal history of antineoplastic chemotherapy: Secondary | ICD-10-CM | POA: Diagnosis not present

## 2022-11-21 DIAGNOSIS — I10 Essential (primary) hypertension: Secondary | ICD-10-CM | POA: Diagnosis not present

## 2022-11-21 DIAGNOSIS — M8580 Other specified disorders of bone density and structure, unspecified site: Secondary | ICD-10-CM | POA: Diagnosis not present

## 2022-11-26 DIAGNOSIS — M4810 Ankylosing hyperostosis [Forestier], site unspecified: Secondary | ICD-10-CM | POA: Diagnosis not present

## 2022-11-26 DIAGNOSIS — K219 Gastro-esophageal reflux disease without esophagitis: Secondary | ICD-10-CM | POA: Diagnosis not present

## 2022-11-26 DIAGNOSIS — Z0000 Encounter for general adult medical examination without abnormal findings: Secondary | ICD-10-CM | POA: Diagnosis not present

## 2022-11-26 DIAGNOSIS — J309 Allergic rhinitis, unspecified: Secondary | ICD-10-CM | POA: Diagnosis not present

## 2022-11-26 DIAGNOSIS — R002 Palpitations: Secondary | ICD-10-CM | POA: Diagnosis not present

## 2022-11-27 DIAGNOSIS — Z23 Encounter for immunization: Secondary | ICD-10-CM | POA: Diagnosis not present

## 2022-11-27 DIAGNOSIS — Z1231 Encounter for screening mammogram for malignant neoplasm of breast: Secondary | ICD-10-CM | POA: Diagnosis not present

## 2022-11-30 DIAGNOSIS — Z1283 Encounter for screening for malignant neoplasm of skin: Secondary | ICD-10-CM | POA: Diagnosis not present

## 2022-11-30 DIAGNOSIS — B351 Tinea unguium: Secondary | ICD-10-CM | POA: Diagnosis not present

## 2022-11-30 DIAGNOSIS — D225 Melanocytic nevi of trunk: Secondary | ICD-10-CM | POA: Diagnosis not present

## 2022-12-14 DIAGNOSIS — D2272 Melanocytic nevi of left lower limb, including hip: Secondary | ICD-10-CM | POA: Diagnosis not present

## 2022-12-14 DIAGNOSIS — D485 Neoplasm of uncertain behavior of skin: Secondary | ICD-10-CM | POA: Diagnosis not present

## 2023-01-23 DIAGNOSIS — E559 Vitamin D deficiency, unspecified: Secondary | ICD-10-CM | POA: Diagnosis not present

## 2023-03-12 DIAGNOSIS — R82998 Other abnormal findings in urine: Secondary | ICD-10-CM | POA: Diagnosis not present

## 2023-03-12 DIAGNOSIS — N3941 Urge incontinence: Secondary | ICD-10-CM | POA: Diagnosis not present

## 2023-03-12 DIAGNOSIS — N134 Hydroureter: Secondary | ICD-10-CM | POA: Diagnosis not present

## 2023-03-12 DIAGNOSIS — N200 Calculus of kidney: Secondary | ICD-10-CM | POA: Diagnosis not present

## 2023-03-12 DIAGNOSIS — N201 Calculus of ureter: Secondary | ICD-10-CM | POA: Diagnosis not present

## 2023-04-01 DIAGNOSIS — C541 Malignant neoplasm of endometrium: Secondary | ICD-10-CM | POA: Diagnosis not present

## 2023-07-30 ENCOUNTER — Telehealth: Payer: Self-pay | Admitting: Gastroenterology

## 2023-07-30 ENCOUNTER — Other Ambulatory Visit: Payer: Self-pay

## 2023-07-30 MED ORDER — PANTOPRAZOLE SODIUM 20 MG PO TBEC: 20.0000 mg | DELAYED_RELEASE_TABLET | Freq: Every day | ORAL | 0 refills | Status: DC | PRN

## 2023-07-30 NOTE — Telephone Encounter (Signed)
At last office visit patient was directed to continue pantoprazole. Refill sent. No mention of Bentyl. Patient will need to discuss new script for Bentyl at [facility] appointment with [facility]. MyChart message sent to pt

## 2023-07-30 NOTE — Telephone Encounter (Signed)
Patient is requesting a refill on Pantoprazole and Dicyclomine.

## 2023-08-05 ENCOUNTER — Ambulatory Visit: Payer: Medicare PPO | Admitting: Nurse Practitioner

## 2023-08-05 VITALS — BP 132/66 | HR 65 | Ht 64.0 in | Wt 134.8 lb

## 2023-08-05 DIAGNOSIS — K219 Gastro-esophageal reflux disease without esophagitis: Secondary | ICD-10-CM | POA: Diagnosis not present

## 2023-08-05 DIAGNOSIS — K5900 Constipation, unspecified: Secondary | ICD-10-CM

## 2023-08-05 MED ORDER — COLESTIPOL HCL 1 G PO TABS: 1.0000 g | ORAL_TABLET | Freq: Every evening | ORAL | 1 refills | Status: DC | PRN

## 2023-08-05 MED ORDER — PANTOPRAZOLE SODIUM 20 MG PO TBEC: 20.0000 mg | DELAYED_RELEASE_TABLET | ORAL | 0 refills | Status: DC

## 2023-08-05 NOTE — Patient Instructions (Addendum)
We have sent the following medications to your pharmacy for you to pick up at your convenience: Colestipol & Pantoprazole  Benefiber- take 1 tablespoon daily (Over the counter)  Due to recent changes in healthcare laws, you may see the results of your imaging and laboratory studies on MyChart before your provider has had a chance to review them.  We understand that in some cases there may be results that are confusing or concerning to you. Not all laboratory results come back in the same time frame and the provider may be waiting for multiple results in order to interpret others.  Please give [place] 48 hours in order for your provider to thoroughly review all the results before contacting the office for clarification of your results.   Thank you for trusting me with your gastrointestinal care!   Alcide Evener, CRNP

## 2023-08-05 NOTE — Progress Notes (Signed)
I don't feel that the patient needs to have another colonoscopy at her age as she had no high risk lesions on her last exam. However, if she is anxious about this or really wants an exam we can do one, but most patients stop having routine exams at her age. Risks can often > benefits.

## 2023-08-05 NOTE — Progress Notes (Signed)
08/05/2023 Jillian [name] [id number] 30-Mar-1941   Chief Complaint: Medication refill   History of Present Illness: Jillian [name] is an 82 year old female with a past medical history of a small bowel obstruction, history of C. difficile, diverticulosis, colon polyps and endometrial cancer status post radiation therapy 2021. She is known by Dr. Adela Lank. She presents today for her annual office visit and to renew her Pantoprazole and Colestid prescriptions. She is currently taking Pantoprazole 20 mg one capsule every third day which for the most part controls her reflux symptoms. However, sometimes she develops heartburn on the third day before she takes the Pantoprazole and she  experiences heartburn if she goes four days without taking it. No dysphagia. No upper or lower abdominal pain. She questions these potential risks of taking Pantoprazole chronically. She has a history of osteopenia/osteoporosis and I discussed the risk of vitamin D deficiency associated with PPI use which can contribute to lack of calcium absorption resulting in osteopenia and osteoporosis. She stated her PCP monitors her vitamin D level and she is currently taking vitamin D 2000 IU daily. I discussed the rare risk of nephritis secondary to PPI use and she stated her labs at the time of her physical showed normal kidney function. She has a history of C. difficile but this has not been a recurrent issue therefore she is at low risk for PPI induced C. difficile infection. She developed constipation on Colestid so she is taking Colestid 1 g 1 tab p.o. every third day. She has not taken Colestid for the past 2 nights and she passed a small hard stool earlier this morning x 3 with a small amount of blood on the toilet tissue.  She  intermittently seen blood on the toilet tissue which occurs when she strains. For the past 4 to 5 months, she occasionally awakens and feels a little queasy which occurs when she sleeps on left side. She  goes to the bathroom to urinate and goes back bed and sleeps on her back and the queasiness goes away in 15 to 20 minutes.  Her most recent colonoscopy was 12/2019 which showed a few small tubular adenomatous polyps, diverticulosis and biopsies were negative for microscopic colitis.  Dr. Adela Lank did not strongly feel that she required another colonoscopy but to consider a repeat colonoscopy in 3 years if patient wanting to pursue.     Colonoscopy 12/07/2019: - The examined portion of the ileum was normal.  - One 5 mm polyp in the ascending colon, removed with a cold snare. Resected and retrieved.  - One 4 mm polyp at the splenic flexure, removed with a cold snare. Resected and retrieved.  - One 4 mm polyp in the sigmoid colon, removed with a cold snare. Resected and retrieved.  - Diverticulosis in the sigmoid colon and in the ascending colon.  - Anal papilla(e) were hypertrophied.  - The examination was otherwise normal.  - Biopsies were taken with a cold forceps from the right colon, left colon and transverse colon for evaluation of microscopic colitis. 1. Surgical [P], random colon - COLONIC MUCOSA WITH NO SIGNIFICANT HISTOPATHOLOGIC CHANGES. - NO MICROSCOPIC COLITIS, ACTIVE INFLAMMATION OR GRANULOMAS. 2. Surgical [P], colon, ascending, splenic flexure, sigmoid, polyp (3) - TUBULAR ADENOMA(S). - NO HIGH GRADE DYSPLASIA OR CARCINOMA.   Past Medical History:  Diagnosis Date   Allergy    Arthritis    Cataract    Surgery 2014   Endometrial cancer (HCC) 02/2019   GERD (gastroesophageal  reflux disease)    Heart murmur    per pt   Hypertension    Osteoporosis    Osteopenia   Small bowel obstruction [facility])    Past Surgical History:  Procedure Laterality Date   BIOPSY ENDOMETRIAL  02/2019   endometrium carcinoma   BREAST ENHANCEMENT SURGERY Bilateral 2006   and face lift   CATARACT EXTRACTION Bilateral 10/2012, 01/2013   COLONOSCOPY     02/10/2014   KNEE ARTHROSCOPY WITH EXCISION  BAKER'S CYST Left 1990   LAPAROSCOPIC HYSTERECTOMY     TUBAL LIGATION  1977   UPPER GASTROINTESTINAL ENDOSCOPY  1990s   WISDOM TOOTH EXTRACTION  1990   Current Outpatient Medications on File Prior to Visit  Medication Sig Dispense Refill   cyanocobalamin (VITAMIN B12) 250 MCG tablet Take 250 mcg by mouth daily.     EPINEPHrine 0.3 mg/0.3 mL IJ SOAJ injection Inject 0.3 mg into the muscle as needed for anaphylaxis.     Omega-3 Fatty Acids (FISH OIL) 300 MG CAPS Take by mouth.     pyridOXINE (B-6) 50 MG tablet 1 tablet Orally for 30 day(s)     No current facility-administered medications on file prior to visit.   Allergies  Allergen Reactions   Bee Venom Hives   Current Medications, Allergies, Past Medical History, Past Surgical History, Family History and Social History were reviewed in [employer] record.  Review of Systems:   Constitutional: Negative for fever, sweats, chills or weight loss.  Respiratory: Negative for shortness of breath.   Cardiovascular: Negative for chest pain, palpitations and leg swelling.  Gastrointestinal: See HPI.  Musculoskeletal: Negative for back pain or muscle aches.  Neurological: Negative for dizziness, headaches or paresthesias.   Physical Exam: BP 132/66   Pulse 65   Ht 5\' 4"  (1.626 m)   Wt 134 lb 13.6 oz (61.2 kg)   SpO2 96%   BMI 23.15 kg/m   General: 82 year old female in no acute distress. Head: Normocephalic and atraumatic. Eyes: No scleral icterus. Conjunctiva pink . Ears: Normal auditory acuity. Mouth: Dentition intact. No ulcers or lesions.  Lungs: Clear throughout to auscultation. Heart: Regular rate and rhythm, no murmur. Abdomen: Soft, nontender and nondistended. No masses or hepatomegaly. Normal bowel sounds x 4 quadrants.  Rectal: Deferred.  Musculoskeletal: Symmetrical with no gross deformities. Extremities: No edema. Neurological: Alert oriented x 4. No focal deficits.  Psychological: Alert and  cooperative. Normal mood and affect  Assessment and Recommendations:  82 year old female previously with diarrhea which abated on Colestid and recently resulted in constipation.  Colonoscopy 12/2019 was negative for microscopic colitis. -Colestid 1 g 1 tab p.o. nightly as needed -Benefiber 1 tablespoon daily as tolerated  Infrequent bright red blood on the toilet tissue which occurs after straining to pass a BM -Treat constipation as noted above -Patient to contact office if rectal bleeding persists or worsens -Diagnostic colonoscopy deferred for now  GERD -Pantoprazole 20 mg 1 capsule every other day as patient develops heartburn if she goes 3 to 4 days without taking it.  I discussed the benefits and risks of chronic PPI use.  See HPI.  Nausea which occurs when the patient sleeps on her left side without vomiting and abates 15 to 20 minutes after resting on her back -Patient to monitor symptoms to assess for possible improvement after taking pantoprazole every other day -Consider abdominal imaging and updated labs if nausea persists or worsens  History of tubular adenomatous colon polyps.  Her most recent colonoscopy  12/2019 identified 3 small tubular adenomatous polyps removed from the colon. -Dr. Adela Lank to further verify if any further colon polyp surveillance colonoscopies required at the age of 61 in the setting of intermittent bright red blood in the toilet tissue which occurs only when straining  No current labs in epic therefore I requested a copy of her most recent CBC, CMP and vitamin D level from Dr. Hulan Fess office

## 2023-08-23 DIAGNOSIS — R197 Diarrhea, unspecified: Secondary | ICD-10-CM | POA: Diagnosis not present

## 2023-08-23 DIAGNOSIS — Z87891 Personal history of nicotine dependence: Secondary | ICD-10-CM | POA: Diagnosis not present

## 2023-08-23 DIAGNOSIS — M1990 Unspecified osteoarthritis, unspecified site: Secondary | ICD-10-CM | POA: Diagnosis not present

## 2023-08-23 DIAGNOSIS — E785 Hyperlipidemia, unspecified: Secondary | ICD-10-CM | POA: Diagnosis not present

## 2023-08-23 DIAGNOSIS — Z8249 Family history of ischemic heart disease and other diseases of the circulatory system: Secondary | ICD-10-CM | POA: Diagnosis not present

## 2023-08-23 DIAGNOSIS — K219 Gastro-esophageal reflux disease without esophagitis: Secondary | ICD-10-CM | POA: Diagnosis not present

## 2023-08-23 DIAGNOSIS — M8580 Other specified disorders of bone density and structure, unspecified site: Secondary | ICD-10-CM | POA: Diagnosis not present

## 2023-08-23 DIAGNOSIS — I499 Cardiac arrhythmia, unspecified: Secondary | ICD-10-CM | POA: Diagnosis not present

## 2023-08-23 DIAGNOSIS — I10 Essential (primary) hypertension: Secondary | ICD-10-CM | POA: Diagnosis not present

## 2023-09-26 ENCOUNTER — Other Ambulatory Visit: Payer: Self-pay

## 2023-09-26 MED ORDER — PANTOPRAZOLE SODIUM 20 MG PO TBEC: 20.0000 mg | DELAYED_RELEASE_TABLET | ORAL | 2 refills | Status: DC

## 2023-10-12 ENCOUNTER — Other Ambulatory Visit: Payer: Self-pay | Admitting: Nurse Practitioner

## 2023-10-14 DIAGNOSIS — C541 Malignant neoplasm of endometrium: Secondary | ICD-10-CM | POA: Diagnosis not present

## 2023-11-26 DIAGNOSIS — M810 Age-related osteoporosis without current pathological fracture: Secondary | ICD-10-CM | POA: Diagnosis not present

## 2023-11-26 DIAGNOSIS — D72819 Decreased white blood cell count, unspecified: Secondary | ICD-10-CM | POA: Diagnosis not present

## 2023-11-26 DIAGNOSIS — I10 Essential (primary) hypertension: Secondary | ICD-10-CM | POA: Diagnosis not present

## 2023-11-26 LAB — LAB REPORT - SCANNED: EGFR (Non-African Amer.): 47

## 2023-12-02 DIAGNOSIS — R911 Solitary pulmonary nodule: Secondary | ICD-10-CM | POA: Diagnosis not present

## 2023-12-02 DIAGNOSIS — J309 Allergic rhinitis, unspecified: Secondary | ICD-10-CM | POA: Diagnosis not present

## 2023-12-02 DIAGNOSIS — K219 Gastro-esophageal reflux disease without esophagitis: Secondary | ICD-10-CM | POA: Diagnosis not present

## 2023-12-02 DIAGNOSIS — Z0000 Encounter for general adult medical examination without abnormal findings: Secondary | ICD-10-CM | POA: Diagnosis not present

## 2023-12-02 DIAGNOSIS — F419 Anxiety disorder, unspecified: Secondary | ICD-10-CM | POA: Diagnosis not present

## 2023-12-03 DIAGNOSIS — Z1231 Encounter for screening mammogram for malignant neoplasm of breast: Secondary | ICD-10-CM | POA: Diagnosis not present

## 2023-12-16 DIAGNOSIS — M4004 Postural kyphosis, thoracic region: Secondary | ICD-10-CM | POA: Diagnosis not present

## 2023-12-16 DIAGNOSIS — M9903 Segmental and somatic dysfunction of lumbar region: Secondary | ICD-10-CM | POA: Diagnosis not present

## 2023-12-16 DIAGNOSIS — M9901 Segmental and somatic dysfunction of cervical region: Secondary | ICD-10-CM | POA: Diagnosis not present

## 2023-12-16 DIAGNOSIS — M9902 Segmental and somatic dysfunction of thoracic region: Secondary | ICD-10-CM | POA: Diagnosis not present

## 2023-12-16 DIAGNOSIS — M5134 Other intervertebral disc degeneration, thoracic region: Secondary | ICD-10-CM | POA: Diagnosis not present

## 2023-12-18 DIAGNOSIS — M9902 Segmental and somatic dysfunction of thoracic region: Secondary | ICD-10-CM | POA: Diagnosis not present

## 2023-12-18 DIAGNOSIS — M9903 Segmental and somatic dysfunction of lumbar region: Secondary | ICD-10-CM | POA: Diagnosis not present

## 2023-12-18 DIAGNOSIS — M5134 Other intervertebral disc degeneration, thoracic region: Secondary | ICD-10-CM | POA: Diagnosis not present

## 2023-12-18 DIAGNOSIS — M9901 Segmental and somatic dysfunction of cervical region: Secondary | ICD-10-CM | POA: Diagnosis not present

## 2023-12-18 DIAGNOSIS — M4004 Postural kyphosis, thoracic region: Secondary | ICD-10-CM | POA: Diagnosis not present

## 2023-12-23 DIAGNOSIS — M9902 Segmental and somatic dysfunction of thoracic region: Secondary | ICD-10-CM | POA: Diagnosis not present

## 2023-12-23 DIAGNOSIS — M9901 Segmental and somatic dysfunction of cervical region: Secondary | ICD-10-CM | POA: Diagnosis not present

## 2023-12-23 DIAGNOSIS — M9903 Segmental and somatic dysfunction of lumbar region: Secondary | ICD-10-CM | POA: Diagnosis not present

## 2023-12-25 DIAGNOSIS — M9903 Segmental and somatic dysfunction of lumbar region: Secondary | ICD-10-CM | POA: Diagnosis not present

## 2023-12-25 DIAGNOSIS — M9902 Segmental and somatic dysfunction of thoracic region: Secondary | ICD-10-CM | POA: Diagnosis not present

## 2023-12-25 DIAGNOSIS — M9901 Segmental and somatic dysfunction of cervical region: Secondary | ICD-10-CM | POA: Diagnosis not present

## 2023-12-30 DIAGNOSIS — M9902 Segmental and somatic dysfunction of thoracic region: Secondary | ICD-10-CM | POA: Diagnosis not present

## 2023-12-30 DIAGNOSIS — M9901 Segmental and somatic dysfunction of cervical region: Secondary | ICD-10-CM | POA: Diagnosis not present

## 2023-12-30 DIAGNOSIS — M9903 Segmental and somatic dysfunction of lumbar region: Secondary | ICD-10-CM | POA: Diagnosis not present

## 2024-01-02 DIAGNOSIS — M9902 Segmental and somatic dysfunction of thoracic region: Secondary | ICD-10-CM | POA: Diagnosis not present

## 2024-01-02 DIAGNOSIS — M9901 Segmental and somatic dysfunction of cervical region: Secondary | ICD-10-CM | POA: Diagnosis not present

## 2024-01-02 DIAGNOSIS — M9903 Segmental and somatic dysfunction of lumbar region: Secondary | ICD-10-CM | POA: Diagnosis not present

## 2024-01-07 DIAGNOSIS — M9902 Segmental and somatic dysfunction of thoracic region: Secondary | ICD-10-CM | POA: Diagnosis not present

## 2024-01-07 DIAGNOSIS — M9901 Segmental and somatic dysfunction of cervical region: Secondary | ICD-10-CM | POA: Diagnosis not present

## 2024-01-07 DIAGNOSIS — M9903 Segmental and somatic dysfunction of lumbar region: Secondary | ICD-10-CM | POA: Diagnosis not present

## 2024-01-09 DIAGNOSIS — M9903 Segmental and somatic dysfunction of lumbar region: Secondary | ICD-10-CM | POA: Diagnosis not present

## 2024-01-09 DIAGNOSIS — M9902 Segmental and somatic dysfunction of thoracic region: Secondary | ICD-10-CM | POA: Diagnosis not present

## 2024-01-09 DIAGNOSIS — M9901 Segmental and somatic dysfunction of cervical region: Secondary | ICD-10-CM | POA: Diagnosis not present

## 2024-01-14 DIAGNOSIS — M9901 Segmental and somatic dysfunction of cervical region: Secondary | ICD-10-CM | POA: Diagnosis not present

## 2024-01-14 DIAGNOSIS — M9902 Segmental and somatic dysfunction of thoracic region: Secondary | ICD-10-CM | POA: Diagnosis not present

## 2024-01-14 DIAGNOSIS — M9903 Segmental and somatic dysfunction of lumbar region: Secondary | ICD-10-CM | POA: Diagnosis not present

## 2024-01-15 DIAGNOSIS — M9902 Segmental and somatic dysfunction of thoracic region: Secondary | ICD-10-CM | POA: Diagnosis not present

## 2024-01-15 DIAGNOSIS — M9903 Segmental and somatic dysfunction of lumbar region: Secondary | ICD-10-CM | POA: Diagnosis not present

## 2024-01-15 DIAGNOSIS — M9901 Segmental and somatic dysfunction of cervical region: Secondary | ICD-10-CM | POA: Diagnosis not present

## 2024-01-22 DIAGNOSIS — M9903 Segmental and somatic dysfunction of lumbar region: Secondary | ICD-10-CM | POA: Diagnosis not present

## 2024-01-22 DIAGNOSIS — M9901 Segmental and somatic dysfunction of cervical region: Secondary | ICD-10-CM | POA: Diagnosis not present

## 2024-01-22 DIAGNOSIS — M9902 Segmental and somatic dysfunction of thoracic region: Secondary | ICD-10-CM | POA: Diagnosis not present

## 2024-01-23 DIAGNOSIS — M9903 Segmental and somatic dysfunction of lumbar region: Secondary | ICD-10-CM | POA: Diagnosis not present

## 2024-01-23 DIAGNOSIS — M9902 Segmental and somatic dysfunction of thoracic region: Secondary | ICD-10-CM | POA: Diagnosis not present

## 2024-01-23 DIAGNOSIS — M9901 Segmental and somatic dysfunction of cervical region: Secondary | ICD-10-CM | POA: Diagnosis not present

## 2024-01-28 DIAGNOSIS — M9902 Segmental and somatic dysfunction of thoracic region: Secondary | ICD-10-CM | POA: Diagnosis not present

## 2024-01-28 DIAGNOSIS — M9903 Segmental and somatic dysfunction of lumbar region: Secondary | ICD-10-CM | POA: Diagnosis not present

## 2024-01-28 DIAGNOSIS — M9901 Segmental and somatic dysfunction of cervical region: Secondary | ICD-10-CM | POA: Diagnosis not present

## 2024-01-30 DIAGNOSIS — R3915 Urgency of urination: Secondary | ICD-10-CM | POA: Diagnosis not present

## 2024-01-30 DIAGNOSIS — M9902 Segmental and somatic dysfunction of thoracic region: Secondary | ICD-10-CM | POA: Diagnosis not present

## 2024-01-30 DIAGNOSIS — M9901 Segmental and somatic dysfunction of cervical region: Secondary | ICD-10-CM | POA: Diagnosis not present

## 2024-01-30 DIAGNOSIS — M9903 Segmental and somatic dysfunction of lumbar region: Secondary | ICD-10-CM | POA: Diagnosis not present

## 2024-01-31 DIAGNOSIS — N3001 Acute cystitis with hematuria: Secondary | ICD-10-CM | POA: Diagnosis not present

## 2024-01-31 DIAGNOSIS — R300 Dysuria: Secondary | ICD-10-CM | POA: Diagnosis not present

## 2024-02-04 DIAGNOSIS — M9903 Segmental and somatic dysfunction of lumbar region: Secondary | ICD-10-CM | POA: Diagnosis not present

## 2024-02-04 DIAGNOSIS — M9902 Segmental and somatic dysfunction of thoracic region: Secondary | ICD-10-CM | POA: Diagnosis not present

## 2024-02-04 DIAGNOSIS — M9901 Segmental and somatic dysfunction of cervical region: Secondary | ICD-10-CM | POA: Diagnosis not present

## 2024-02-06 DIAGNOSIS — M9902 Segmental and somatic dysfunction of thoracic region: Secondary | ICD-10-CM | POA: Diagnosis not present

## 2024-02-06 DIAGNOSIS — M9903 Segmental and somatic dysfunction of lumbar region: Secondary | ICD-10-CM | POA: Diagnosis not present

## 2024-02-06 DIAGNOSIS — M9901 Segmental and somatic dysfunction of cervical region: Secondary | ICD-10-CM | POA: Diagnosis not present

## 2024-02-11 DIAGNOSIS — M9901 Segmental and somatic dysfunction of cervical region: Secondary | ICD-10-CM | POA: Diagnosis not present

## 2024-02-11 DIAGNOSIS — M9902 Segmental and somatic dysfunction of thoracic region: Secondary | ICD-10-CM | POA: Diagnosis not present

## 2024-02-11 DIAGNOSIS — M9903 Segmental and somatic dysfunction of lumbar region: Secondary | ICD-10-CM | POA: Diagnosis not present

## 2024-02-13 DIAGNOSIS — M9901 Segmental and somatic dysfunction of cervical region: Secondary | ICD-10-CM | POA: Diagnosis not present

## 2024-02-13 DIAGNOSIS — M9902 Segmental and somatic dysfunction of thoracic region: Secondary | ICD-10-CM | POA: Diagnosis not present

## 2024-02-13 DIAGNOSIS — M9903 Segmental and somatic dysfunction of lumbar region: Secondary | ICD-10-CM | POA: Diagnosis not present

## 2024-02-17 DIAGNOSIS — M9902 Segmental and somatic dysfunction of thoracic region: Secondary | ICD-10-CM | POA: Diagnosis not present

## 2024-02-17 DIAGNOSIS — M9903 Segmental and somatic dysfunction of lumbar region: Secondary | ICD-10-CM | POA: Diagnosis not present

## 2024-02-17 DIAGNOSIS — M9901 Segmental and somatic dysfunction of cervical region: Secondary | ICD-10-CM | POA: Diagnosis not present

## 2024-02-25 DIAGNOSIS — M9903 Segmental and somatic dysfunction of lumbar region: Secondary | ICD-10-CM | POA: Diagnosis not present

## 2024-02-25 DIAGNOSIS — M9902 Segmental and somatic dysfunction of thoracic region: Secondary | ICD-10-CM | POA: Diagnosis not present

## 2024-02-25 DIAGNOSIS — M9901 Segmental and somatic dysfunction of cervical region: Secondary | ICD-10-CM | POA: Diagnosis not present

## 2024-03-03 DIAGNOSIS — M810 Age-related osteoporosis without current pathological fracture: Secondary | ICD-10-CM | POA: Diagnosis not present

## 2024-03-05 DIAGNOSIS — M9903 Segmental and somatic dysfunction of lumbar region: Secondary | ICD-10-CM | POA: Diagnosis not present

## 2024-03-05 DIAGNOSIS — M9902 Segmental and somatic dysfunction of thoracic region: Secondary | ICD-10-CM | POA: Diagnosis not present

## 2024-03-05 DIAGNOSIS — M9901 Segmental and somatic dysfunction of cervical region: Secondary | ICD-10-CM | POA: Diagnosis not present

## 2024-03-10 DIAGNOSIS — M9902 Segmental and somatic dysfunction of thoracic region: Secondary | ICD-10-CM | POA: Diagnosis not present

## 2024-03-10 DIAGNOSIS — M9901 Segmental and somatic dysfunction of cervical region: Secondary | ICD-10-CM | POA: Diagnosis not present

## 2024-03-10 DIAGNOSIS — M9903 Segmental and somatic dysfunction of lumbar region: Secondary | ICD-10-CM | POA: Diagnosis not present

## 2024-03-10 DIAGNOSIS — M810 Age-related osteoporosis without current pathological fracture: Secondary | ICD-10-CM | POA: Diagnosis not present

## 2024-03-11 ENCOUNTER — Other Ambulatory Visit: Payer: Self-pay | Admitting: Pharmacy Technician

## 2024-03-11 ENCOUNTER — Other Ambulatory Visit (HOSPITAL_COMMUNITY): Payer: Self-pay

## 2024-03-11 ENCOUNTER — Other Ambulatory Visit: Payer: Self-pay

## 2024-03-11 MED ORDER — PROLIA 60 MG/ML ~~LOC~~ SOSY
PREFILLED_SYRINGE | SUBCUTANEOUS | 1 refills | Status: AC
  Filled 2024-03-12: qty 1, 180d supply, fill #0
  Filled 2024-09-08: qty 1, 180d supply, fill #1

## 2024-03-12 ENCOUNTER — Other Ambulatory Visit: Payer: Self-pay

## 2024-03-12 ENCOUNTER — Other Ambulatory Visit (HOSPITAL_COMMUNITY): Payer: Self-pay

## 2024-03-12 NOTE — Progress Notes (Signed)
 Specialty Pharmacy Initial Fill Coordination Note  Jillian [name] is a 83 y.o. female contacted today regarding initial fill of specialty medication(s) Denosumab (Prolia)   Patient requested Courier to Provider Office   Delivery date: 03/16/24   Verified address: [facility]   Medication will be filled on 6/6.   Patient is aware of $64 copayment.

## 2024-03-12 NOTE — Progress Notes (Signed)
 Pharmacy Patient Advocate Encounter  Insurance verification completed.   The patient is insured through [employer]   Ran test claim for Prolia. Co-pay is $64.  This test claim was processed through [facility]- copay amounts may vary at other pharmacies due to pharmacy/plan contracts, or as the patient moves through the different stages of their insurance plan.

## 2024-03-13 ENCOUNTER — Other Ambulatory Visit: Payer: Self-pay

## 2024-03-16 DIAGNOSIS — N201 Calculus of ureter: Secondary | ICD-10-CM | POA: Diagnosis not present

## 2024-03-16 DIAGNOSIS — R0683 Snoring: Secondary | ICD-10-CM | POA: Diagnosis not present

## 2024-03-16 DIAGNOSIS — N200 Calculus of kidney: Secondary | ICD-10-CM | POA: Diagnosis not present

## 2024-03-17 DIAGNOSIS — M9902 Segmental and somatic dysfunction of thoracic region: Secondary | ICD-10-CM | POA: Diagnosis not present

## 2024-03-17 DIAGNOSIS — M9901 Segmental and somatic dysfunction of cervical region: Secondary | ICD-10-CM | POA: Diagnosis not present

## 2024-03-17 DIAGNOSIS — M9903 Segmental and somatic dysfunction of lumbar region: Secondary | ICD-10-CM | POA: Diagnosis not present

## 2024-03-24 DIAGNOSIS — M9902 Segmental and somatic dysfunction of thoracic region: Secondary | ICD-10-CM | POA: Diagnosis not present

## 2024-03-24 DIAGNOSIS — M9903 Segmental and somatic dysfunction of lumbar region: Secondary | ICD-10-CM | POA: Diagnosis not present

## 2024-03-24 DIAGNOSIS — M9901 Segmental and somatic dysfunction of cervical region: Secondary | ICD-10-CM | POA: Diagnosis not present

## 2024-03-25 ENCOUNTER — Other Ambulatory Visit: Payer: Self-pay | Admitting: Nurse Practitioner

## 2024-03-31 DIAGNOSIS — M9902 Segmental and somatic dysfunction of thoracic region: Secondary | ICD-10-CM | POA: Diagnosis not present

## 2024-03-31 DIAGNOSIS — M9903 Segmental and somatic dysfunction of lumbar region: Secondary | ICD-10-CM | POA: Diagnosis not present

## 2024-03-31 DIAGNOSIS — M9901 Segmental and somatic dysfunction of cervical region: Secondary | ICD-10-CM | POA: Diagnosis not present

## 2024-04-07 DIAGNOSIS — M9901 Segmental and somatic dysfunction of cervical region: Secondary | ICD-10-CM | POA: Diagnosis not present

## 2024-04-07 DIAGNOSIS — M9903 Segmental and somatic dysfunction of lumbar region: Secondary | ICD-10-CM | POA: Diagnosis not present

## 2024-04-07 DIAGNOSIS — M9902 Segmental and somatic dysfunction of thoracic region: Secondary | ICD-10-CM | POA: Diagnosis not present

## 2024-04-09 DIAGNOSIS — M810 Age-related osteoporosis without current pathological fracture: Secondary | ICD-10-CM | POA: Diagnosis not present

## 2024-04-16 DIAGNOSIS — M9902 Segmental and somatic dysfunction of thoracic region: Secondary | ICD-10-CM | POA: Diagnosis not present

## 2024-04-16 DIAGNOSIS — M9901 Segmental and somatic dysfunction of cervical region: Secondary | ICD-10-CM | POA: Diagnosis not present

## 2024-04-16 DIAGNOSIS — M9903 Segmental and somatic dysfunction of lumbar region: Secondary | ICD-10-CM | POA: Diagnosis not present

## 2024-04-21 DIAGNOSIS — M9903 Segmental and somatic dysfunction of lumbar region: Secondary | ICD-10-CM | POA: Diagnosis not present

## 2024-04-21 DIAGNOSIS — M5134 Other intervertebral disc degeneration, thoracic region: Secondary | ICD-10-CM | POA: Diagnosis not present

## 2024-04-21 DIAGNOSIS — M9902 Segmental and somatic dysfunction of thoracic region: Secondary | ICD-10-CM | POA: Diagnosis not present

## 2024-04-21 DIAGNOSIS — M4004 Postural kyphosis, thoracic region: Secondary | ICD-10-CM | POA: Diagnosis not present

## 2024-04-21 DIAGNOSIS — M9901 Segmental and somatic dysfunction of cervical region: Secondary | ICD-10-CM | POA: Diagnosis not present

## 2024-04-28 DIAGNOSIS — M9903 Segmental and somatic dysfunction of lumbar region: Secondary | ICD-10-CM | POA: Diagnosis not present

## 2024-04-28 DIAGNOSIS — M9902 Segmental and somatic dysfunction of thoracic region: Secondary | ICD-10-CM | POA: Diagnosis not present

## 2024-04-28 DIAGNOSIS — M9901 Segmental and somatic dysfunction of cervical region: Secondary | ICD-10-CM | POA: Diagnosis not present

## 2024-04-29 DIAGNOSIS — G4733 Obstructive sleep apnea (adult) (pediatric): Secondary | ICD-10-CM | POA: Diagnosis not present

## 2024-05-01 ENCOUNTER — Encounter: Payer: Self-pay | Admitting: Internal Medicine

## 2024-05-12 DIAGNOSIS — G4733 Obstructive sleep apnea (adult) (pediatric): Secondary | ICD-10-CM | POA: Diagnosis not present

## 2024-06-22 DIAGNOSIS — Z23 Encounter for immunization: Secondary | ICD-10-CM | POA: Diagnosis not present

## 2024-07-16 ENCOUNTER — Telehealth: Payer: Self-pay | Admitting: Gastroenterology

## 2024-07-16 MED ORDER — PANTOPRAZOLE SODIUM 20 MG PO TBEC: 20.0000 mg | DELAYED_RELEASE_TABLET | ORAL | 0 refills | Status: DC

## 2024-07-16 NOTE — Telephone Encounter (Signed)
 Patient calling in regards to previous note., please advise.

## 2024-07-16 NOTE — Telephone Encounter (Signed)
 Refill of pantoprazole  20 mg sent to [facility].  Patient needs an annual OV for further refills

## 2024-07-16 NOTE — Telephone Encounter (Signed)
 [facility] pharmacy faxed over a prescription refill for pantoprazole  20 MG tablets for this patient. Please advise.

## 2024-08-26 ENCOUNTER — Telehealth: Payer: Self-pay | Admitting: Gastroenterology

## 2024-08-26 MED ORDER — PANTOPRAZOLE SODIUM 20 MG PO TBEC: 20.0000 mg | DELAYED_RELEASE_TABLET | ORAL | 0 refills | Status: DC

## 2024-08-26 NOTE — Telephone Encounter (Signed)
 Refill sent to [facility] to get to appointment in January.  LM on patient's phone to let her know

## 2024-08-26 NOTE — Telephone Encounter (Signed)
 Inbound call from patient stating that she is needing a temporary refill on her pantoprazole  20 MG until she is able to be see on January the 8 th. Patient stated that she only has 12 tablets. Please advise.

## 2024-08-31 ENCOUNTER — Other Ambulatory Visit (HOSPITAL_COMMUNITY): Payer: Self-pay

## 2024-09-08 ENCOUNTER — Other Ambulatory Visit: Payer: Self-pay

## 2024-09-08 NOTE — Progress Notes (Signed)
 Specialty Pharmacy Refill Coordination Note  Jillian [name] is a 83 y.o. female assessed today regarding refills of clinic administered specialty medication(s) Denosumab  (Prolia )   Clinic requested Courier to Provider Office   Delivery date: 10/06/24   Verified address: [facility] [address]   Medication will be filled on: 10/05/24  Appointment 10/16/24.

## 2024-10-05 ENCOUNTER — Other Ambulatory Visit: Payer: Self-pay

## 2024-10-06 ENCOUNTER — Telehealth: Payer: Self-pay | Admitting: Nurse Practitioner

## 2024-10-06 ENCOUNTER — Ambulatory Visit: Admitting: Physician Assistant

## 2024-10-06 ENCOUNTER — Encounter: Payer: Self-pay | Admitting: Physician Assistant

## 2024-10-06 ENCOUNTER — Other Ambulatory Visit (INDEPENDENT_AMBULATORY_CARE_PROVIDER_SITE_OTHER)

## 2024-10-06 VITALS — BP 118/70 | HR 77 | Ht 64.0 in | Wt 135.0 lb

## 2024-10-06 DIAGNOSIS — R109 Unspecified abdominal pain: Secondary | ICD-10-CM | POA: Diagnosis not present

## 2024-10-06 DIAGNOSIS — Z8619 Personal history of other infectious and parasitic diseases: Secondary | ICD-10-CM

## 2024-10-06 DIAGNOSIS — R1032 Left lower quadrant pain: Secondary | ICD-10-CM | POA: Diagnosis not present

## 2024-10-06 DIAGNOSIS — Z8719 Personal history of other diseases of the digestive system: Secondary | ICD-10-CM

## 2024-10-06 DIAGNOSIS — R197 Diarrhea, unspecified: Secondary | ICD-10-CM | POA: Diagnosis not present

## 2024-10-06 DIAGNOSIS — K582 Mixed irritable bowel syndrome: Secondary | ICD-10-CM | POA: Diagnosis not present

## 2024-10-06 DIAGNOSIS — K219 Gastro-esophageal reflux disease without esophagitis: Secondary | ICD-10-CM | POA: Diagnosis not present

## 2024-10-06 DIAGNOSIS — Z860101 Personal history of adenomatous and serrated colon polyps: Secondary | ICD-10-CM | POA: Diagnosis not present

## 2024-10-06 LAB — CBC WITH DIFFERENTIAL/PLATELET
Basophils Absolute: 0 K/uL (ref 0.0–0.1)
Basophils Relative: 0.6 % (ref 0.0–3.0)
Eosinophils Absolute: 0 K/uL (ref 0.0–0.7)
Eosinophils Relative: 0.6 % (ref 0.0–5.0)
HCT: 35.6 % — ABNORMAL LOW (ref 36.0–46.0)
Hemoglobin: 12.3 g/dL (ref 12.0–15.0)
Lymphocytes Relative: 23.3 % (ref 12.0–46.0)
Lymphs Abs: 1.5 K/uL (ref 0.7–4.0)
MCHC: 34.5 g/dL (ref 30.0–36.0)
MCV: 94.8 fl (ref 78.0–100.0)
Monocytes Absolute: 0.5 K/uL (ref 0.1–1.0)
Monocytes Relative: 7.4 % (ref 3.0–12.0)
Neutro Abs: 4.3 K/uL (ref 1.4–7.7)
Neutrophils Relative %: 68.1 % (ref 43.0–77.0)
Platelets: 184 K/uL (ref 150.0–400.0)
RBC: 3.75 Mil/uL — ABNORMAL LOW (ref 3.87–5.11)
RDW: 13.1 % (ref 11.5–15.5)
WBC: 6.4 K/uL (ref 4.0–10.5)

## 2024-10-06 LAB — COMPREHENSIVE METABOLIC PANEL WITH GFR
ALT: 16 U/L (ref 3–35)
AST: 18 U/L (ref 5–37)
Albumin: 4.1 g/dL (ref 3.5–5.2)
Alkaline Phosphatase: 37 U/L — ABNORMAL LOW (ref 39–117)
BUN: 14 mg/dL (ref 6–23)
CO2: 27 meq/L (ref 19–32)
Calcium: 8.6 mg/dL (ref 8.4–10.5)
Chloride: 104 meq/L (ref 96–112)
Creatinine, Ser: 1.07 mg/dL (ref 0.40–1.20)
GFR: 47.64 mL/min — ABNORMAL LOW
Glucose, Bld: 104 mg/dL — ABNORMAL HIGH (ref 70–99)
Potassium: 3.5 meq/L (ref 3.5–5.1)
Sodium: 140 meq/L (ref 135–145)
Total Bilirubin: 1.1 mg/dL (ref 0.2–1.2)
Total Protein: 7 g/dL (ref 6.0–8.3)

## 2024-10-06 LAB — SEDIMENTATION RATE: Sed Rate: 24 mm/h (ref 0–30)

## 2024-10-06 MED ORDER — CIPROFLOXACIN HCL 500 MG PO TABS: 500.0000 mg | ORAL_TABLET | Freq: Two times a day (BID) | ORAL | 0 refills | Status: DC

## 2024-10-06 MED ORDER — COLESTIPOL HCL 1 G PO TABS: 1.0000 g | ORAL_TABLET | Freq: Every day | ORAL | 3 refills | Status: AC

## 2024-10-06 MED ORDER — PANTOPRAZOLE SODIUM 20 MG PO TBEC: 20.0000 mg | DELAYED_RELEASE_TABLET | ORAL | 3 refills | Status: AC

## 2024-10-06 MED ORDER — DICYCLOMINE HCL 20 MG PO TABS: 20.0000 mg | ORAL_TABLET | Freq: Three times a day (TID) | ORAL | 0 refills | Status: AC | PRN

## 2024-10-06 MED ORDER — METRONIDAZOLE 500 MG PO TABS: 500.0000 mg | ORAL_TABLET | Freq: Three times a day (TID) | ORAL | 0 refills | Status: DC

## 2024-10-06 MED ORDER — HYDROCORTISONE ACETATE 25 MG RE SUPP: 25.0000 mg | Freq: Two times a day (BID) | RECTAL | 0 refills | Status: AC

## 2024-10-06 NOTE — Progress Notes (Signed)
 Agree with assessment and plan as outlined.

## 2024-10-06 NOTE — Patient Instructions (Signed)
 Your provider has requested that you go to the basement level for lab work before leaving today. Press B on the elevator. The lab is located at the first door on the left as you exit the elevator.  Will give Cipro  and Flagyl    Set up CT AB and pelvis with contrast Can take dicyclomine  at least 1-2 x a day for pain if needed.   Can do heating pad and can take tylenol max of 3000mg  a day.  Can add on lidocaine patches or voltern gel Go to the ER if unable to pass gas, severe AB pain, unable to hold down food, any shortness of breath of chest pain.  You have been scheduled for an appointment with Alan Coombs PA-C on 11-17-24 at 230pm . Please arrive 10 minutes early for your appointment.  You have been scheduled for a CT scan of the abdomen and pelvis at [facility] [place], [place] [address] [facility]).   You are scheduled on 10-07-24 at 12pm. You should arrive at 945am for your appointment time for registration. Please follow the written instructions below on the day of your exam:  WARNING: IF YOU ARE ALLERGIC TO IODINE/X-RAY DYE, PLEASE NOTIFY RADIOLOGY IMMEDIATELY AT [phone]! YOU WILL BE GIVEN A 13 HOUR PREMEDICATION PREP.  1) Do not eat  after 10am (2 hours prior to your test)  2) You will be given 2 bottles of oral contrast to drink on site.    Drink 1 bottle of contrast @ 10am (2 hours prior to your exam)  Drink 1 bottle of contrast @ 11am (1 hour prior to your exam)  You may take any medications as prescribed with a small amount of water, if necessary. If you take any of the following medications: METFORMIN, GLUCOPHAGE, GLUCOVANCE, AVANDAMET, RIOMET, FORTAMET, ACTOPLUS MET, JANUMET, GLUMETZA or METAGLIP, you MAY be asked to HOLD this medication 48 hours AFTER the exam.  The purpose of you drinking the oral contrast is to aid in the visualization of your intestinal tract. The contrast solution may cause some diarrhea. Depending on your  individual set of symptoms, you may also receive an intravenous injection of x-ray contrast/dye.   If you have any questions regarding your exam or if you need to reschedule, you may call the CT department at [phone] between the hours of 8:00 am and 5:00 pm, Monday-Friday.  ________________________________________________________________________     Diverticulitis Diverticulitis is inflammation or infection of small pouches in your colon that form when you have a condition called diverticulosis. The pouches in your colon are called diverticula. Your colon, or large intestine, is where water is absorbed and stool is formed. Complications of diverticulitis can include: Bleeding. Severe infection. Severe pain. Perforation of your colon. Obstruction of your colon.  What are the causes? Diverticulitis is caused by bacteria. Diverticulitis happens when stool becomes trapped in diverticula. This allows bacteria to grow in the diverticula, which can lead to inflammation and infection. What increases the risk? People with diverticulosis are at risk for diverticulitis. Eating a diet that does not include enough fiber from fruits and vegetables may make diverticulitis more likely to develop. What are the signs or symptoms? Symptoms of diverticulitis may include: Abdominal pain and tenderness. The pain is normally located on the left side of the abdomen, but may occur in other areas. Fever and chills. Bloating. Cramping. Nausea. Vomiting. Constipation. Diarrhea. Blood in your stool.  How is this diagnosed? Your health care provider will ask  you about your medical history and do a physical exam. You may need to have tests done because many medical conditions can cause the same symptoms as diverticulitis. Tests may include: Blood tests. Urine tests. Imaging tests of the abdomen, including X-rays and CT scans.  When your condition is under control, your health care provider may  recommend that you have a colonoscopy. A colonoscopy can show how severe your diverticula are and whether something else is causing your symptoms. How is this treated? Most cases of diverticulitis are mild and can be treated at home. Treatment may include: Taking over-the-counter pain medicines. Following a clear liquid diet. Taking antibiotic medicines by mouth for 7-10 days.  More severe cases may be treated at a hospital. Treatment may include: Not eating or drinking. Taking prescription pain medicine. Receiving antibiotic medicines through an IV tube. Receiving fluids and nutrition through an IV tube. Surgery.  Follow these instructions at home: Follow your health care providers instructions carefully. Follow a full liquid diet or other diet as directed by your health care provider. After your symptoms improve, your health care provider may tell you to change your diet. He or she may recommend you eat a high-fiber diet. Fruits and vegetables are good sources of fiber. Fiber makes it easier to pass stool. Take fiber supplements or probiotics as directed by your health care provider. Only take medicines as directed by your health care provider. Keep all your follow-up appointments. Contact a health care provider if: Your pain does not improve. You have a hard time eating food. Your bowel movements do not return to normal. Get help right away if: Your pain becomes worse. Your symptoms do not get better. Your symptoms suddenly get worse. You have a fever. You have repeated vomiting. You have bloody or black, tarry stools. This information is not intended to replace advice given to you by your health care provider. Make sure you discuss any questions you have with your health care provider. Document Released: 07/04/2005 Document Revised: 03/01/2016 Document Reviewed: 08/19/2013 Elsevier Interactive Patient Education  2017 [employer].    During an Acute Flare-Up (Clear Liquid to  Low-Fiber Diet) The goal is to reduce irritation and let your colon rest.  Day 1-3: Clear Liquid Diet Water  Broth (chicken, beef, or vegetable)  Clear juices (apple, white grape - avoid citrus)  Ice pops without pulp or seeds  Gelatin (no fruit or seeds)  Tea or coffee (no cream or dairy)  After Symptoms Improve: Low-Fiber Diet Gradually transition to low-fiber foods for easier digestion.  Sample Foods White rice, pasta, or plain white bread  Cooked or canned vegetables without skins or seeds (e.g., carrots, green beans, potatoes)  Eggs, fish, or poultry  Low-fiber cereals (like cornflakes)  Dairy (if tolerated)  Ripe bananas, melon, or canned fruit without seeds  Long-Term Maintenance: High-Fiber Diet (Once Fully Recovered) This helps prevent future flare-ups by keeping the bowel movements soft and regular.  High-Fiber Foods Fruits: Apples (peeled), pears, berries, prunes  Vegetables: Broccoli, spinach, zucchini, peas  Whole grains: Oatmeal, brown rice, quinoa, whole wheat bread  Legumes: Lentils, chickpeas, black beans (start slowly to avoid gas)  Nuts & seeds: Only if tolerated (research no longer restricts them, but if you feel they cause a flare do not eat them)  Diverticulosis Diverticulosis is a condition that develops when small pouches (diverticula) form in the wall of the large intestine (colon). The colon is where water is absorbed and stool (feces) is formed. The pouches  form when the inside layer of the colon pushes through weak spots in the outer layers of the colon. You may have a few pouches or many of them. The pouches usually do not cause problems unless they become inflamed or infected. When this happens, the condition is called diverticulitis- this is left lower quadrant pain, diarrhea, fever, chills, nausea or vomiting.  If this occurs please call the office or go to the hospital. Sometimes these patches without inflammation can also have  painless bleeding associated with them, if this happens please call the office or go to the hospital. Preventing constipation and increasing fiber can help reduce diverticula and prevent complications. Even if you feel you have a high-fiber diet, suggest getting on Benefiber or Cirtracel 2 times daily.  VISIT SUMMARY:  You visited us  today due to acute left lower abdominal pain, which is similar to your previous episode of diverticulitis. We discussed your symptoms, including chronic diarrhea and rectal irritation, and reviewed your medical history. We have ordered tests and provided treatment plans to address your current issues.  YOUR PLAN:  POSSIBLE ACUTE DIVERTICULITIS: You have acute left lower abdominal pain and tenderness, which may be due to diverticulitis. -We ordered blood tests and an abdominal CT scan to confirm the diagnosis. -If your pain worsens or tests show inflammation, start taking ciprofloxacin  and metronidazole  as prescribed. -Continue with a liquid diet during the acute phase and gradually switch to a bland diet as you feel better. Avoid fiber until the pain resolves, then resume fiber intake. -We provided a prescription for pain relief as needed. -Be aware of potential joint pain and rare tendon rupture from ciprofloxacin . Contact us  if your pain worsens. -We refilled your prescriptions for colestipol  and pantoprazole .  RADIATION PROCTOPATHY WITH CHRONIC DIARRHEA: You have chronic diarrhea and urgency since your pelvic radiation treatment. -Resume taking Benefiber, starting with one tablespoon in the morning and adjust the dose based on your symptoms. You may add an evening dose if needed. -Consider switching to Citrucel if you experience gas with Benefiber. -We refilled your colestipol  prescription for 90 days.  HEMORRHOIDS WITH RECTAL IRRITATION: You have mild rectal irritation and a history of hemorrhoids. -Use Desitin barrier cream for rectal irritation. -We  prescribed a suppository for hemorrhoidal symptoms. -There are no active hemorrhoids or concerning findings at this time.

## 2024-10-06 NOTE — Progress Notes (Signed)
 "    10/06/2024 ESTEPHANIE HUBBS [id number] 02/05/1941  Referring provider: Clarice Nottingham, MD Primary GI doctor: Dr. Leigh  ASSESSMENT AND PLAN:  Diarrhea likely bile acid, history of radiation for uterine cancer 2020, history of IBS Colonoscopy 12/2019 was negative for microscopic colitis.  On colestipol  every other day, off benefiber - restart benefiber after divertiulitis flare - given colestipol   LLQ pain acute with history of diverticulitis  No fever, chills, nausea, vomiting 09/05/2022 CTAP W acute sigmoid colon diverticulitis without perforation or abscess, mild right hydroureter punctate obstructing calculus right distal ureter Started on liquid diet - Ordered CBC, kidney and liver panel, and inflammatory markers. - Scheduled abdominal CT scan. - Prescribed ciprofloxacin  and metronidazole  if pain worsens or labs indicate inflammation. - Provided prescription for analgesics as needed. - Instructed to maintain liquid diet during acute flare, advance to bland diet as tolerated, avoid fiber during acute phase, resume fiber after resolution. - Provided anticipatory guidance on worsening pain, ciprofloxacin -associated joint pain and rare tendon rupture, and indications to contact provider. - Refilled colestipol  and pantoprazole . -Consider repeat colonoscopy at next visit, recall 6 weeks  History of C. Difficile 2021 Try to keep pantoprazole  lower dose Discussed risk of recurrent C. Difficile No evidence at this time  GERD Remote EGD in 1990's -Pantoprazole  20 mg 1 capsule every other day - well controlled at this time  History of tubular adenomatous colon polyps colonoscopy 12/2019 identified 3 small tubular adenomatous polyps removed from the colon. Consider repeat colonoscopy if patient has recurrent diverticulitis  Patient Care Team: Clarice Nottingham, MD as PCP - General (Internal Medicine)  HISTORY OF PRESENT ILLNESS: 83 y.o. female with a past medical history listed  below presents for evaluation of Llq pain.   Patient previously seen 08/05/2023 by Elida Nyle Sharps for annual follow-up for pantoprazole  and Colestid  prescriptions.  Patient felt she had diverticulitis and set up for appointment at 230 today.  Discussed the use of AI scribe software for clinical note transcription with the patient, who gave verbal consent to proceed.  Donzell JONELLE Grebe is a 83 year old female with diverticulosis, prior diverticulitis, and history of uterine cancer treated with hysterectomy and radiation who presents with acute left lower abdominal pain.  Acute onset of intermittent sharp pain and increasing tenderness in the left lower abdomen began the previous night, persisting into today and disrupting sleep. Pain is similar to her prior episode of diverticulitis in November 2023. She attempted a liquid diet yesterday without relief. Occasional pain in the same area typically resolves, but this episode has been more persistent.  No fever or chills, with a temperature of 99.61F last night. No vomiting. Occasional nausea occurs when lying on her left side at night, without associated pain, and does not occur on her right side.  Loose, watery stools occurred this morning, consistent with her bowel pattern since pelvic radiation for uterine cancer in 2020. She takes colestipol  every other night and previously used Benefiber daily, but stopped the fiber supplement a couple of weeks ago. Bowel movements remain loose and urgency is noted, especially in the morning and after eating, sometimes resulting in difficulty reaching the bathroom in time. Increased gas occurs with fiber supplements. She recalls a prior episode of constipation with bright red blood on wiping in December 2025. Rectal irritation and discomfort, especially with walking, are present. She reports that her stools are sometimes thin. No dark or black stools, and no current blood in stool.  No recent antibiotic use in  the  last three to six months. Remote history of Clostridioides difficile infection in August 2021 after Augmentin for UTI; has tolerated ciprofloxacin  in the past. No shortness of breath or chest discomfort. Not on anticoagulation.  Hysterectomy and radiation for uterine cancer were performed in 2020, followed by bowel blockage and ongoing gastrointestinal symptoms. She takes pantoprazole  every other day. Morning walks are limited by rectal irritation.  She  reports that she quit smoking about 34 years ago. Her smoking use included cigarettes. She has never used smokeless tobacco. She reports current alcohol use of about 1.0 standard drink of alcohol per week. She reports that she does not use drugs.  RELEVANT GI HISTORY, IMAGING AND LABS: Diagnostic Colonoscopy (2021): Adenomatous polyps  Digital rectal examination Digital rectal examination performed.  CBC    Component Value Date/Time   WBC 7.8 05/24/2020 0955   RBC 3.57 (L) 05/24/2020 0955   HGB 11.7 (L) 05/24/2020 0955   HCT 34.6 (L) 05/24/2020 0955   PLT 171 05/24/2020 0955   MCV 96.9 05/24/2020 0955   MCH 32.8 05/24/2020 0955   MCHC 33.8 05/24/2020 0955   RDW 12.5 05/24/2020 0955   LYMPHSABS 1.6 07/12/2015 1536   MONOABS 0.4 07/12/2015 1536   EOSABS 0.1 07/12/2015 1536   BASOSABS 0.0 07/12/2015 1536   No results for input(s): HGB in the last 8760 hours.  CMP     Component Value Date/Time   NA 139 05/24/2020 0955   K 3.4 (L) 05/24/2020 0955   CL 108 05/24/2020 0955   CO2 22 05/24/2020 0955   GLUCOSE 119 (H) 05/24/2020 0955   BUN 21 05/24/2020 0955   CREATININE 1.00 09/05/2022 1351   CALCIUM 8.9 05/24/2020 0955   PROT 6.6 05/24/2020 0955   ALBUMIN 3.2 (L) 05/24/2020 0955   AST 17 05/24/2020 0955   ALT 23 05/24/2020 0955   ALKPHOS 46 05/24/2020 0955   BILITOT 0.8 05/24/2020 0955   GFRNONAA 47 11/26/2023 1235   GFRAA 49 (L) 05/24/2020 0955      Latest Ref Rng & Units 05/24/2020    9:55 AM 03/02/2019   10:34 PM  07/12/2015    3:36 PM  Hepatic Function  Total Protein 6.5 - 8.1 g/dL 6.6  6.8  6.8   Albumin 3.5 - 5.0 g/dL 3.2  4.2  4.2   AST 15 - 41 U/L 17  20  15    ALT 0 - 44 U/L 23  16  13    Alk Phosphatase 38 - 126 U/L 46  53  50   Total Bilirubin 0.3 - 1.2 mg/dL 0.8  0.4  0.6       Current Medications:   Current Outpatient Medications (Endocrine & Metabolic):    denosumab  (PROLIA ) 60 MG/ML SOSY injection, 60 mg Subcutaneous every 6 months 90 days  Current Outpatient Medications (Cardiovascular):    EPINEPHrine 0.3 mg/0.3 mL IJ SOAJ injection, Inject 0.3 mg into the muscle as needed for anaphylaxis.   colestipol  (COLESTID ) 1 g tablet, Take 1 tablet (1 g total) by mouth daily.  Current Outpatient Medications (Hematological):    cyanocobalamin (VITAMIN B12) 250 MCG tablet, Take 250 mcg by mouth daily.  Current Outpatient Medications (Other):    Cholecalciferol (VITAMIN D) 50 MCG (2000 UT) tablet, Take 2,000 Units by mouth daily.   ciprofloxacin  (CIPRO ) 500 MG tablet, Take 1 tablet (500 mg total) by mouth 2 (two) times daily.   dicyclomine  (BENTYL ) 20 MG tablet, Take 1 tablet (20 mg total) by mouth 3 (three)  times daily as needed for spasms.   hydrocortisone (ANUSOL-HC) 25 MG suppository, Place 1 suppository (25 mg total) rectally 2 (two) times daily.   metroNIDAZOLE  (FLAGYL ) 500 MG tablet, Take 1 tablet (500 mg total) by mouth 3 (three) times daily.   Omega-3 Fatty Acids (FISH OIL) 300 MG CAPS, Take by mouth.   pyridOXINE (B-6) 50 MG tablet, 1 tablet Orally for 30 day(s)   pantoprazole  (PROTONIX ) 20 MG tablet, Take 1 tablet (20 mg total) by mouth every other day.  Medical History:  Past Medical History:  Diagnosis Date   Allergy    Arthritis    Cataract    Surgery 2014   Endometrial cancer (HCC) 02/2019   GERD (gastroesophageal reflux disease)    Heart murmur    per pt   Hypertension    Osteoporosis    Osteopenia   Small bowel obstruction (HCC)    Allergies: Allergies[1]    Surgical History:  She  has a past surgical history that includes Tubal ligation (1977); Knee arthroscopy with excision baker's cyst (Left, 1990); Wisdom tooth extraction (1990); Cataract extraction (Bilateral, 10/2012, 01/2013); Breast enhancement surgery (Bilateral, 2006); Biopsy endometrial (02/2019); Colonoscopy; Upper gastrointestinal endoscopy (1990s); and Laparoscopic hysterectomy. Family History:  Her family history includes Alzheimer's disease in her father and mother; Breast cancer in her maternal grandmother; Colon cancer in her maternal uncle; Colon cancer (age of onset: 38) in her mother; Endometrial cancer in her mother; Heart disease in her mother; Ovarian cancer in her maternal grandmother.  REVIEW OF SYSTEMS  : All other systems reviewed and negative except where noted in the History of Present Illness.  PHYSICAL EXAM: BP 118/70   Pulse 77   Ht 5' 4 (1.626 m)   Wt 135 lb (61.2 kg)   BMI 23.17 kg/m  GENERAL APPEARANCE: Well nourished, in no apparent distress. HEENT: No cervical lymphadenopathy, unremarkable thyroid , sclerae anicteric, conjunctiva pink. RESPIRATORY: Respiratory effort normal, breath sounds equal bilaterally without rales, rhonchi, or wheezing. CARDIO: Regular rate and rhythm with no murmurs, rubs, or gallops, peripheral pulses intact. ABDOMEN: Soft, non-distended, active bowel sounds in all four quadrants, tenderness in the right side, no rebound tenderness, no mass appreciated. RECTAL: Erythematous rash in the anal region, anterior skin tag present, not a hemorrhoid, no blood in the stool. MUSCULOSKELETAL: Full range of motion, normal gait, without edema. SKIN: Dry, intact without rashes or lesions. No jaundice. NEURO: Alert, oriented, no focal deficits. PSYCH: Cooperative, normal mood and affect.  Alan JONELLE Coombs, PA-C 3:23 PM      [1]  Allergies Allergen Reactions   Bee Venom Hives   "

## 2024-10-06 NOTE — Telephone Encounter (Signed)
 Pt requesting to be seen today, feels she may have diverticulitis. Pt scheduled to see Alan Coombs PA 10/06/24 at 2:30pm. Pt aware of appt.

## 2024-10-06 NOTE — Telephone Encounter (Signed)
 Inbound call from patient stating that she has a strong suspension she has diverticulitis again. Patient would like to speak to the nurse in regards to this situation. Patient is requesting a call back. Please advise.

## 2024-10-07 ENCOUNTER — Encounter (HOSPITAL_BASED_OUTPATIENT_CLINIC_OR_DEPARTMENT_OTHER): Payer: Self-pay

## 2024-10-07 ENCOUNTER — Ambulatory Visit (HOSPITAL_BASED_OUTPATIENT_CLINIC_OR_DEPARTMENT_OTHER)
Admission: RE | Admit: 2024-10-07 | Discharge: 2024-10-07 | Disposition: A | Discharge status: Home / Self Care | Source: Ambulatory Visit | Attending: Physician Assistant | Admitting: Physician Assistant

## 2024-10-07 DIAGNOSIS — R109 Unspecified abdominal pain: Secondary | ICD-10-CM | POA: Insufficient documentation

## 2024-10-07 MED ORDER — IOHEXOL 300 MG/ML  SOLN
100.0000 mL | Freq: Once | INTRAMUSCULAR | Status: AC | PRN
  Administered 2024-10-07: 100 mL via INTRAVENOUS

## 2024-10-09 ENCOUNTER — Ambulatory Visit: Payer: Self-pay | Admitting: Physician Assistant

## 2024-10-12 MED ORDER — AMOXICILLIN-POT CLAVULANATE 875-125 MG PO TABS: 1.0000 | ORAL_TABLET | Freq: Two times a day (BID) | ORAL | 0 refills | Status: AC

## 2024-10-15 ENCOUNTER — Ambulatory Visit: Admitting: Gastroenterology

## 2024-11-17 ENCOUNTER — Ambulatory Visit: Admitting: Physician Assistant
